# Patient Record
Sex: Female | Born: 1935 | Race: Black or African American | Hispanic: No | Marital: Married | Smoking: Never smoker
Health system: Southern US, Community
[De-identification: ages and names within clinical notes are randomized; demographics above are authoritative.]

## PROBLEM LIST (undated history)

## (undated) DIAGNOSIS — R413 Other amnesia: Secondary | ICD-10-CM

## (undated) DIAGNOSIS — I059 Rheumatic mitral valve disease, unspecified: Secondary | ICD-10-CM

## (undated) DIAGNOSIS — E785 Hyperlipidemia, unspecified: Secondary | ICD-10-CM

## (undated) DIAGNOSIS — F439 Reaction to severe stress, unspecified: Secondary | ICD-10-CM

## (undated) DIAGNOSIS — E7800 Pure hypercholesterolemia, unspecified: Secondary | ICD-10-CM

## (undated) DIAGNOSIS — H109 Unspecified conjunctivitis: Secondary | ICD-10-CM

## (undated) DIAGNOSIS — J0100 Acute maxillary sinusitis, unspecified: Secondary | ICD-10-CM

## (undated) DIAGNOSIS — I10 Essential (primary) hypertension: Secondary | ICD-10-CM

## (undated) DIAGNOSIS — J209 Acute bronchitis, unspecified: Secondary | ICD-10-CM

## (undated) DIAGNOSIS — J301 Allergic rhinitis due to pollen: Secondary | ICD-10-CM

## (undated) DIAGNOSIS — N390 Urinary tract infection, site not specified: Secondary | ICD-10-CM

## (undated) HISTORY — PX: ABDOMINAL HYSTERECTOMY: SHX81

## (undated) HISTORY — DX: Reaction to severe stress, unspecified: F43.9

## (undated) HISTORY — DX: Acute bronchitis, unspecified: J20.9

## (undated) HISTORY — DX: Acute maxillary sinusitis, unspecified: J01.00

## (undated) HISTORY — DX: Essential (primary) hypertension: I10

## (undated) HISTORY — DX: Rheumatic mitral valve disease, unspecified: I05.9

## (undated) HISTORY — PX: TONSILLECTOMY: SUR1361

## (undated) HISTORY — DX: Other amnesia: R41.3

## (undated) HISTORY — DX: Hyperlipidemia, unspecified: E78.5

## (undated) HISTORY — DX: Pure hypercholesterolemia, unspecified: E78.00

## (undated) HISTORY — PX: TUBAL LIGATION: SHX77

## (undated) HISTORY — DX: Allergic rhinitis due to pollen: J30.1

## (undated) HISTORY — DX: Urinary tract infection, site not specified: N39.0

## (undated) HISTORY — DX: Unspecified conjunctivitis: H10.9

---

## 1998-01-24 ENCOUNTER — Other Ambulatory Visit: Admission: RE | Admit: 1998-01-24 | Discharge: 1998-01-24 | Payer: Self-pay | Admitting: Obstetrics & Gynecology

## 1998-02-15 ENCOUNTER — Ambulatory Visit (HOSPITAL_COMMUNITY): Admission: RE | Admit: 1998-02-15 | Discharge: 1998-02-15 | Payer: Self-pay | Admitting: Internal Medicine

## 1998-03-29 ENCOUNTER — Ambulatory Visit (HOSPITAL_COMMUNITY): Admission: RE | Admit: 1998-03-29 | Discharge: 1998-03-29 | Payer: Self-pay | Admitting: Internal Medicine

## 1999-02-06 ENCOUNTER — Ambulatory Visit (HOSPITAL_COMMUNITY): Admission: RE | Admit: 1999-02-06 | Discharge: 1999-02-06 | Payer: Self-pay | Admitting: Gastroenterology

## 1999-03-19 ENCOUNTER — Other Ambulatory Visit: Admission: RE | Admit: 1999-03-19 | Discharge: 1999-03-19 | Payer: Self-pay | Admitting: *Deleted

## 1999-08-07 ENCOUNTER — Encounter: Admission: RE | Admit: 1999-08-07 | Discharge: 1999-08-07 | Payer: Self-pay | Admitting: Internal Medicine

## 1999-08-07 ENCOUNTER — Encounter: Payer: Self-pay | Admitting: Internal Medicine

## 2000-03-25 ENCOUNTER — Other Ambulatory Visit: Admission: RE | Admit: 2000-03-25 | Discharge: 2000-03-25 | Payer: Self-pay | Admitting: Obstetrics and Gynecology

## 2000-04-07 ENCOUNTER — Encounter: Payer: Self-pay | Admitting: Obstetrics and Gynecology

## 2000-04-07 ENCOUNTER — Encounter: Admission: RE | Admit: 2000-04-07 | Discharge: 2000-04-07 | Payer: Self-pay | Admitting: Obstetrics and Gynecology

## 2001-03-28 ENCOUNTER — Other Ambulatory Visit: Admission: RE | Admit: 2001-03-28 | Discharge: 2001-03-28 | Payer: Self-pay | Admitting: Obstetrics and Gynecology

## 2001-04-07 ENCOUNTER — Encounter: Admission: RE | Admit: 2001-04-07 | Discharge: 2001-04-07 | Payer: Self-pay | Admitting: Obstetrics and Gynecology

## 2001-04-07 ENCOUNTER — Encounter: Payer: Self-pay | Admitting: Obstetrics and Gynecology

## 2002-01-19 ENCOUNTER — Encounter (INDEPENDENT_AMBULATORY_CARE_PROVIDER_SITE_OTHER): Payer: Self-pay | Admitting: Cardiology

## 2002-01-19 ENCOUNTER — Ambulatory Visit (HOSPITAL_COMMUNITY): Admission: RE | Admit: 2002-01-19 | Discharge: 2002-01-19 | Payer: Self-pay | Admitting: Cardiology

## 2002-03-06 ENCOUNTER — Encounter: Payer: Self-pay | Admitting: Internal Medicine

## 2002-03-06 ENCOUNTER — Encounter: Admission: RE | Admit: 2002-03-06 | Discharge: 2002-03-06 | Payer: Self-pay | Admitting: Internal Medicine

## 2002-06-07 ENCOUNTER — Encounter: Admission: RE | Admit: 2002-06-07 | Discharge: 2002-06-07 | Payer: Self-pay | Admitting: Obstetrics and Gynecology

## 2002-06-07 ENCOUNTER — Encounter: Payer: Self-pay | Admitting: Obstetrics and Gynecology

## 2003-06-20 ENCOUNTER — Encounter: Admission: RE | Admit: 2003-06-20 | Discharge: 2003-06-20 | Payer: Self-pay | Admitting: Internal Medicine

## 2003-06-26 ENCOUNTER — Other Ambulatory Visit: Admission: RE | Admit: 2003-06-26 | Discharge: 2003-06-26 | Payer: Self-pay | Admitting: Obstetrics and Gynecology

## 2004-04-30 ENCOUNTER — Ambulatory Visit (HOSPITAL_COMMUNITY): Admission: RE | Admit: 2004-04-30 | Discharge: 2004-04-30 | Payer: Self-pay | Admitting: Gastroenterology

## 2004-07-15 ENCOUNTER — Encounter: Admission: RE | Admit: 2004-07-15 | Discharge: 2004-07-15 | Payer: Self-pay | Admitting: Internal Medicine

## 2004-08-22 ENCOUNTER — Encounter (INDEPENDENT_AMBULATORY_CARE_PROVIDER_SITE_OTHER): Payer: Self-pay | Admitting: *Deleted

## 2004-08-22 ENCOUNTER — Encounter: Admission: RE | Admit: 2004-08-22 | Discharge: 2004-08-22 | Payer: Self-pay | Admitting: Internal Medicine

## 2004-12-23 ENCOUNTER — Encounter: Admission: RE | Admit: 2004-12-23 | Discharge: 2004-12-23 | Payer: Self-pay | Admitting: Internal Medicine

## 2005-06-22 ENCOUNTER — Ambulatory Visit (HOSPITAL_COMMUNITY): Admission: RE | Admit: 2005-06-22 | Discharge: 2005-06-22 | Payer: Self-pay | Admitting: Cardiology

## 2005-07-16 ENCOUNTER — Encounter: Admission: RE | Admit: 2005-07-16 | Discharge: 2005-07-16 | Payer: Self-pay | Admitting: Internal Medicine

## 2006-01-05 ENCOUNTER — Other Ambulatory Visit: Admission: RE | Admit: 2006-01-05 | Discharge: 2006-01-05 | Payer: Self-pay | Admitting: Obstetrics and Gynecology

## 2006-02-22 ENCOUNTER — Encounter: Admission: RE | Admit: 2006-02-22 | Discharge: 2006-02-22 | Payer: Self-pay | Admitting: Internal Medicine

## 2006-03-11 ENCOUNTER — Encounter: Admission: RE | Admit: 2006-03-11 | Discharge: 2006-03-11 | Payer: Self-pay | Admitting: Internal Medicine

## 2006-07-19 ENCOUNTER — Encounter: Admission: RE | Admit: 2006-07-19 | Discharge: 2006-07-19 | Payer: Self-pay | Admitting: Internal Medicine

## 2006-12-27 ENCOUNTER — Encounter: Admission: RE | Admit: 2006-12-27 | Discharge: 2006-12-27 | Payer: Self-pay | Admitting: Internal Medicine

## 2007-02-01 ENCOUNTER — Encounter: Admission: RE | Admit: 2007-02-01 | Discharge: 2007-02-01 | Payer: Self-pay | Admitting: Obstetrics and Gynecology

## 2007-07-20 IMAGING — CR DG CHEST 2V
2 series · 2 of 2 positions shown · non-contrast
Comparison: 02/22/2006

CLINICAL DATA: Cough.

[w chest pa]
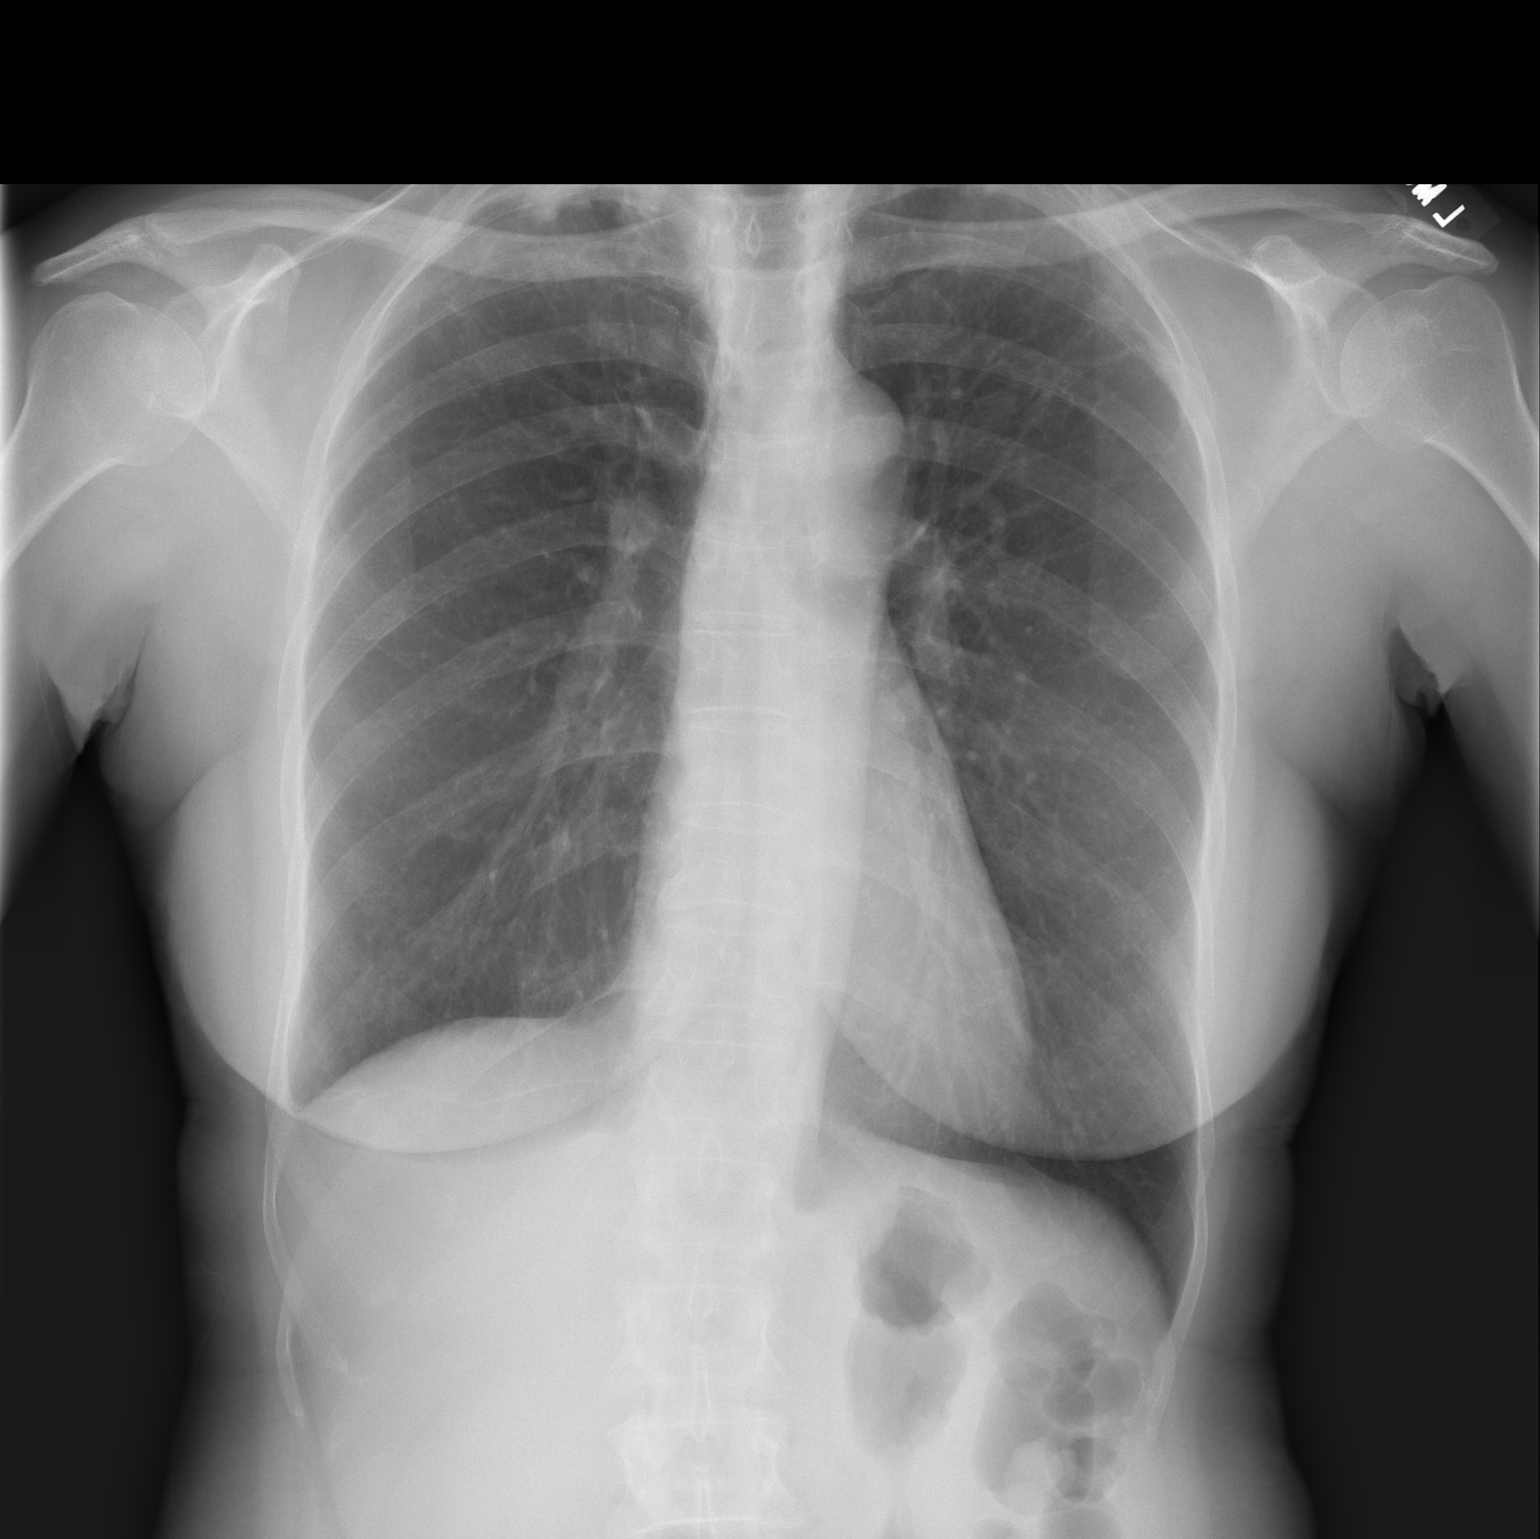

[w chest lat]
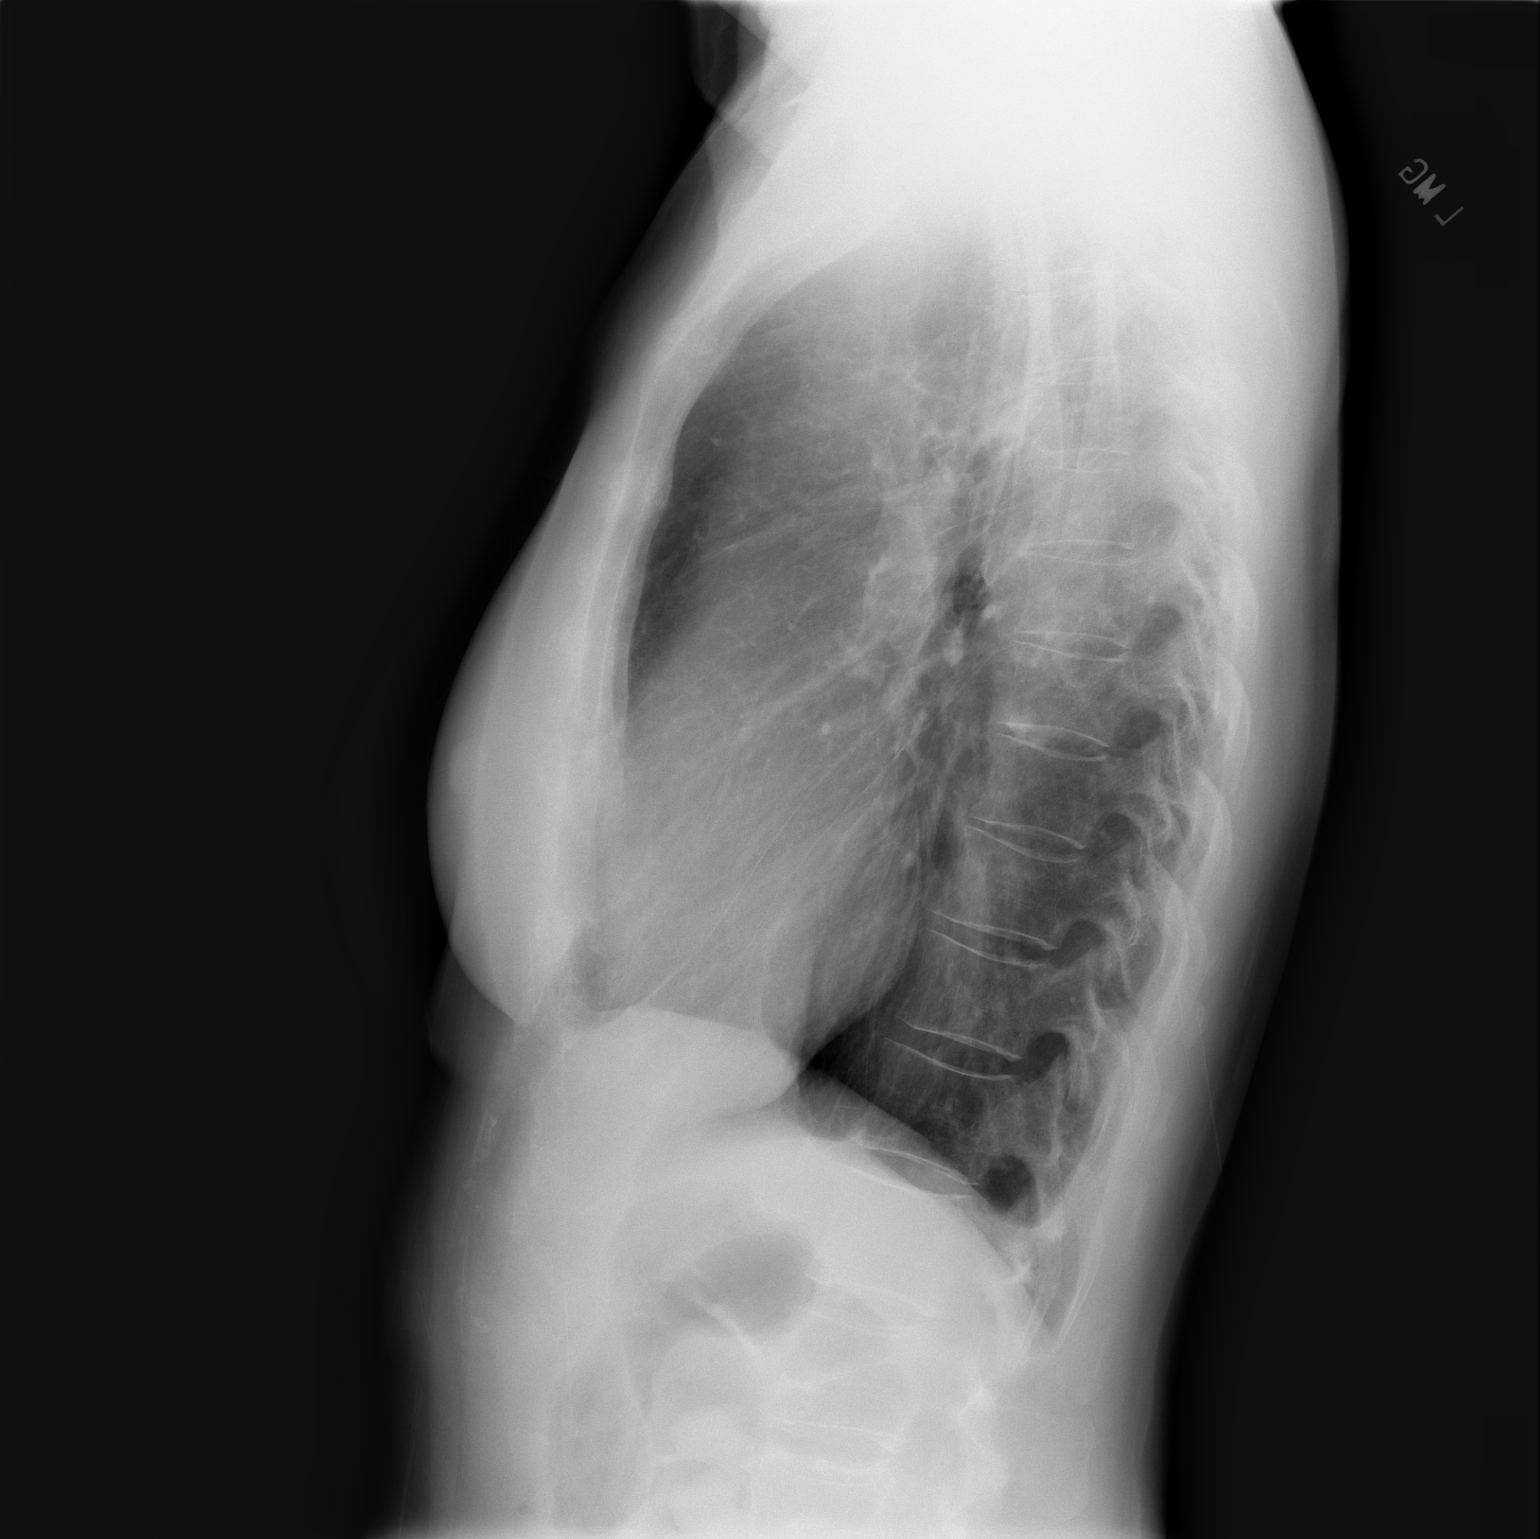

[2 of 2 positions shown; findings below may reference images not displayed]

CHEST - 2 VIEW:

Lungs remain hyperinflated. There is trace blunting of the right costophrenic
angle, unchanged. Biapical pleural densities are stable and asymmetrically worse
on the right than left. Heart size is within normal limits.
IMPRESSION: No interval change. There is asymmetric apical opacity on the right with
associated tiny right pleural effusion versus pleural scarring. Given the
asymmetry of the changes in the right apex, a followup chest x-ray in 3 months
is recommended.

## 2007-08-11 ENCOUNTER — Encounter: Admission: RE | Admit: 2007-08-11 | Discharge: 2007-08-11 | Payer: Self-pay | Admitting: Internal Medicine

## 2008-04-20 ENCOUNTER — Encounter: Admission: RE | Admit: 2008-04-20 | Discharge: 2008-04-20 | Payer: Self-pay | Admitting: Internal Medicine

## 2008-04-22 ENCOUNTER — Emergency Department (HOSPITAL_COMMUNITY): Admission: EM | Admit: 2008-04-22 | Discharge: 2008-04-22 | Payer: Self-pay | Admitting: Emergency Medicine

## 2008-05-10 ENCOUNTER — Encounter: Admission: RE | Admit: 2008-05-10 | Discharge: 2008-05-10 | Payer: Self-pay | Admitting: Internal Medicine

## 2008-08-29 ENCOUNTER — Encounter: Admission: RE | Admit: 2008-08-29 | Discharge: 2008-08-29 | Payer: Self-pay | Admitting: Internal Medicine

## 2009-04-27 ENCOUNTER — Emergency Department (HOSPITAL_COMMUNITY): Admission: EM | Admit: 2009-04-27 | Discharge: 2009-04-27 | Payer: Self-pay | Admitting: Family Medicine

## 2009-04-27 ENCOUNTER — Emergency Department (HOSPITAL_COMMUNITY): Admission: EM | Admit: 2009-04-27 | Discharge: 2009-04-27 | Payer: Self-pay | Admitting: Emergency Medicine

## 2009-07-22 ENCOUNTER — Encounter: Admission: RE | Admit: 2009-07-22 | Discharge: 2009-07-22 | Payer: Self-pay | Admitting: Internal Medicine

## 2009-10-15 ENCOUNTER — Encounter: Admission: RE | Admit: 2009-10-15 | Discharge: 2009-10-15 | Payer: Self-pay | Admitting: Internal Medicine

## 2010-04-20 ENCOUNTER — Encounter: Payer: Self-pay | Admitting: Internal Medicine

## 2010-07-14 LAB — COMPREHENSIVE METABOLIC PANEL
AST: 27 U/L (ref 0–37)
Albumin: 3.7 g/dL (ref 3.5–5.2)
Alkaline Phosphatase: 70 U/L (ref 39–117)
CO2: 28 mEq/L (ref 19–32)
Chloride: 105 mEq/L (ref 96–112)
GFR calc Af Amer: >60 mL/min (ref >60)
GFR calc non Af Amer: >60 mL/min (ref >60)
Potassium: 3.4 mEq/L — ABNORMAL LOW (ref 3.5–5.1)
Total Bilirubin: 0.8 mg/dL (ref 0.3–1.2)

## 2010-07-14 LAB — CBC
HCT: 36.2 % (ref 36.0–46.0)
Platelets: 193 10*3/uL (ref 150–400)
RBC: 3.72 MIL/uL — ABNORMAL LOW (ref 3.87–5.11)
WBC: 6.2 10*3/uL (ref 4.0–10.5)

## 2010-07-14 LAB — URINALYSIS, ROUTINE W REFLEX MICROSCOPIC
Ketones, ur: 15 mg/dL — AB
Nitrite: NEGATIVE
Protein, ur: 30 mg/dL — AB

## 2010-07-14 LAB — DIFFERENTIAL
Basophils Absolute: 0 10*3/uL (ref 0.0–0.1)
Basophils Relative: 0 % (ref 0–1)
Eosinophils Absolute: 0 10*3/uL (ref 0.0–0.7)
Eosinophils Relative: 1 % (ref 0–5)
Monocytes Absolute: 0.3 10*3/uL (ref 0.1–1.0)

## 2010-07-14 LAB — URINE MICROSCOPIC-ADD ON

## 2010-07-17 ENCOUNTER — Encounter: Payer: Self-pay | Admitting: Internal Medicine

## 2010-08-15 NOTE — Op Note (Signed)
NAME:  Sarah Zavala, Sarah Zavala                 ACCOUNT NO.:  [id number]   MEDICAL RECORD NO.:  [id number]          PATIENT TYPE:  AMB   LOCATION:  ENDO                         FACILITY:  [facility]   PHYSICIAN:  Anselmo Rod, M.D.  DATE OF BIRTH:  12/13/1929   DATE OF PROCEDURE:  04/30/2004  DATE OF DISCHARGE:                                 OPERATIVE REPORT   PROCEDURE PERFORMED:  Screening colonoscopy.   ENDOSCOPIST:  Charna Elizabeth, M.D.   INSTRUMENT USED:  Olympus video colonoscope.   INDICATIONS FOR PROCEDURE:  A 77 year old African-American female with a  history of left lower quadrant pain, occasional constipation, undergoing a  screening colonoscopy to rule out colonic polyps, masses, etc.   PREPROCEDURE PREPARATION:  Informed consent was procured from the patient.  The patient was fasted for eight hours prior to the procedure and prepped  with a bottle of magnesium citrate and a gallon of GoLYTELY the night prior  to the procedure.  The risks and benefits of the procedure including a 10%  miss rate for colon polyps or cancers was discussed with the patient as  well.   PREPROCEDURE PHYSICAL:  The patient had stable vital signs.  Neck supple.  Chest clear to auscultation.  S1 and S2 regular.  Abdomen soft with normal  bowel sounds.   DESCRIPTION OF PROCEDURE:  The patient was placed in left lateral decubitus  position and sedated with 75 mg of Demerol and 5 mg of Versed in slow  incremental doses.  Once the patient was adequately sedated and maintained  on low flow oxygen and continuous cardiac monitoring, the Olympus video  colonoscope was advanced from the rectum to the cecum.  The appendicular  orifice and ileocecal valve were clearly visualized and photographed.  No  masses, polyps, erosions, ulcerations or diverticula were seen.  Retroflexion in the rectum revealed  no abnormalities.   IMPRESSION:  Normal colonoscopy up to the cecum.  No masses or polyps were  seen.  No evidence  of diverticulosis.   RECOMMENDATIONS:  1.  Continue high fiber diet with liberal fluid intake.  2.  Repeat colonoscopy is recommended in the next 10 years unless the      patient develops any abnormal symptoms in the interim.  3.  Outpatient followup as need arises in the future.      JNM/MEDQ  D:  04/30/2004  T:  04/30/2004  Job:  [id number]   cc:   Minerva Areola L. August Saucer, M.D.  [address] [address]  [place]  [place] [address]  Fax: [phone]

## 2011-01-05 ENCOUNTER — Other Ambulatory Visit: Payer: Self-pay | Admitting: Internal Medicine

## 2011-01-05 DIAGNOSIS — Z1231 Encounter for screening mammogram for malignant neoplasm of breast: Secondary | ICD-10-CM

## 2011-01-08 ENCOUNTER — Ambulatory Visit
Admission: RE | Admit: 2011-01-08 | Discharge: 2011-01-08 | Disposition: A | Discharge status: Home / Self Care | Payer: Medicare HMO | Source: Ambulatory Visit | Attending: Internal Medicine | Admitting: Internal Medicine

## 2011-01-08 DIAGNOSIS — Z1231 Encounter for screening mammogram for malignant neoplasm of breast: Secondary | ICD-10-CM

## 2011-01-14 ENCOUNTER — Other Ambulatory Visit: Payer: Self-pay | Admitting: Internal Medicine

## 2011-01-14 DIAGNOSIS — R928 Other abnormal and inconclusive findings on diagnostic imaging of breast: Secondary | ICD-10-CM

## 2011-01-20 ENCOUNTER — Ambulatory Visit
Admission: RE | Admit: 2011-01-20 | Discharge: 2011-01-20 | Disposition: A | Discharge status: Home / Self Care | Payer: Medicare HMO | Source: Ambulatory Visit | Attending: Internal Medicine | Admitting: Internal Medicine

## 2011-01-20 DIAGNOSIS — R928 Other abnormal and inconclusive findings on diagnostic imaging of breast: Secondary | ICD-10-CM

## 2011-01-27 ENCOUNTER — Other Ambulatory Visit: Payer: Medicare HMO

## 2012-01-07 ENCOUNTER — Other Ambulatory Visit: Payer: Self-pay | Admitting: Internal Medicine

## 2012-01-07 DIAGNOSIS — Z1231 Encounter for screening mammogram for malignant neoplasm of breast: Secondary | ICD-10-CM

## 2012-02-10 ENCOUNTER — Ambulatory Visit
Admission: RE | Admit: 2012-02-10 | Discharge: 2012-02-10 | Disposition: A | Discharge status: Home / Self Care | Payer: Medicare HMO | Source: Ambulatory Visit | Attending: Internal Medicine | Admitting: Internal Medicine

## 2012-02-10 DIAGNOSIS — Z1231 Encounter for screening mammogram for malignant neoplasm of breast: Secondary | ICD-10-CM

## 2012-03-14 LAB — LIPID PANEL
LDL Cholesterol: 81 mg/dL
LDl/HDL Ratio: 2

## 2012-03-14 LAB — BASIC METABOLIC PANEL
BUN: 11 mg/dL (ref 4–21)
Creatinine: 0.7 mg/dL (ref 0.5–1.1)
Potassium: 4 mmol/L (ref 3.4–5.3)
Sodium: 141 mmol/L (ref 137–147)

## 2012-03-14 LAB — HEPATIC FUNCTION PANEL
AST: 20 U/L (ref 13–35)
Bilirubin, Total: 0.4 mg/dL

## 2012-03-14 LAB — TSH: TSH: 2.85 u[IU]/mL (ref 0.41–5.90)

## 2012-05-26 ENCOUNTER — Encounter: Payer: Self-pay | Admitting: Hematology

## 2012-09-09 ENCOUNTER — Ambulatory Visit (HOSPITAL_COMMUNITY)
Admission: RE | Admit: 2012-09-09 | Discharge: 2012-09-09 | Disposition: A | Discharge status: Home / Self Care | Payer: Medicare HMO | Source: Ambulatory Visit | Attending: Internal Medicine | Admitting: Internal Medicine

## 2012-09-09 ENCOUNTER — Other Ambulatory Visit (HOSPITAL_COMMUNITY): Payer: Self-pay | Admitting: Internal Medicine

## 2012-09-09 DIAGNOSIS — R0602 Shortness of breath: Secondary | ICD-10-CM | POA: Insufficient documentation

## 2012-09-09 DIAGNOSIS — M25569 Pain in unspecified knee: Secondary | ICD-10-CM

## 2012-09-09 DIAGNOSIS — M7989 Other specified soft tissue disorders: Secondary | ICD-10-CM

## 2012-09-09 DIAGNOSIS — M79609 Pain in unspecified limb: Secondary | ICD-10-CM

## 2012-09-09 NOTE — Progress Notes (Signed)
*  PRELIMINARY RESULTS* Vascular Ultrasound Lower extremity venous duplex has been completed.  Preliminary findings: negative for DVT bilaterally.    Farrel Demark, RDMS, RVT  09/09/2012, 2:36 PM

## 2012-10-21 ENCOUNTER — Other Ambulatory Visit: Payer: Self-pay | Admitting: Internal Medicine

## 2012-10-21 DIAGNOSIS — R1310 Dysphagia, unspecified: Secondary | ICD-10-CM

## 2012-10-25 ENCOUNTER — Ambulatory Visit
Admission: RE | Admit: 2012-10-25 | Discharge: 2012-10-25 | Disposition: A | Discharge status: Home / Self Care | Payer: Medicare HMO | Source: Ambulatory Visit | Attending: Internal Medicine | Admitting: Internal Medicine

## 2012-10-25 DIAGNOSIS — R1310 Dysphagia, unspecified: Secondary | ICD-10-CM

## 2013-01-03 ENCOUNTER — Other Ambulatory Visit: Payer: Self-pay

## 2013-01-03 DIAGNOSIS — Z1231 Encounter for screening mammogram for malignant neoplasm of breast: Secondary | ICD-10-CM

## 2013-01-20 ENCOUNTER — Encounter (HOSPITAL_COMMUNITY): Payer: Self-pay | Admitting: Emergency Medicine

## 2013-01-20 ENCOUNTER — Emergency Department (INDEPENDENT_AMBULATORY_CARE_PROVIDER_SITE_OTHER)
Admission: EM | Admit: 2013-01-20 | Discharge: 2013-01-20 | Disposition: A | Discharge status: Home / Self Care | Payer: Medicare HMO | Source: Home / Self Care | Attending: Family Medicine | Admitting: Family Medicine

## 2013-01-20 ENCOUNTER — Emergency Department (INDEPENDENT_AMBULATORY_CARE_PROVIDER_SITE_OTHER): Payer: Medicare HMO

## 2013-01-20 DIAGNOSIS — M6530 Trigger finger, unspecified finger: Secondary | ICD-10-CM

## 2013-01-20 DIAGNOSIS — M65341 Trigger finger, right ring finger: Secondary | ICD-10-CM

## 2013-01-20 NOTE — ED Provider Notes (Signed)
CSN: [id number]     Arrival date & time 01/20/13  0272 History   None    Chief Complaint  Patient presents with  . Hand Pain   (Consider location/radiation/quality/duration/timing/severity/associated sxs/prior Treatment) Patient is a 79 y.o. female presenting with hand pain. The history is provided by the patient.  Hand Pain This is a new problem. The current episode started more than 1 week ago (palmar soreness and triggering of rrf, similar problem occurred to right thumb and injected by dr Merlyn Lot.). The problem has not changed since onset.   Past Medical History  Diagnosis Date  . Acute bronchitis   . Allergic rhinitis due to pollen   . Acute maxillary sinusitis   . Essential hypertension, malignant   . Pure hypercholesterolemia   . Mitral valve disorders   . Hyperlipidemia   . UTI (lower urinary tract infection)   . Situational stress   . Conjunctivitis unspecified   . 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 No pertinent past surgical history. History reviewed. No pertinent family history. History  Substance Use Topics  . Smoking status: Never Smoker   . Smokeless tobacco: Not on file  . Alcohol Use: Not on file   OB History   Grav Para Term Preterm Abortions TAB SAB Ect Mult Living                 Review of Systems  Constitutional: Negative.   Musculoskeletal: Negative for joint swelling.  Skin: Negative.     Allergies  Acyclovir and related; Amoxicillin; Biaxin; and Ciprofloxacin  Home Medications   Current Outpatient Rx  Name  Route  Sig  Dispense  Refill  . aspirin 81 MG tablet   Oral   Take 81 mg by mouth daily.           . Calcium Carbonate-Vitamin D (CALCIUM PLUS VITAMIN D PO)   Oral   Take 1,200 mg by mouth daily.           Marland Kitchen diltiazem (DILACOR XR) 120 MG 24 hr capsule   Oral   Take 120 mg by mouth daily.         . dorzolamide (TRUSOPT) 2 % ophthalmic solution   Both Eyes   Place 1 drop into both eyes 2 (two) times  daily.         . memantine (NAMENDA) 10 MG tablet   Oral   Take 10 mg by mouth at bedtime.           . Multiple Vitamins-Minerals (CENTRUM SILVER PO)   Oral   Take by mouth daily.           . Omega-3 Fatty Acids (FISH OIL PO)   Oral   Take 1,000 mg by mouth daily.           . rosuvastatin (CRESTOR) 5 MG tablet   Oral   Take 10 mg by mouth daily.          . valACYclovir (VALTREX) 500 MG tablet   Oral   Take 500 mg by mouth daily.            BP 140/48  Pulse 72  Temp(Src) 97.8 F (36.6 C) (Oral)  Resp 18  SpO2 99% Physical Exam  Nursing note and vitals reviewed. Constitutional: She is oriented to person, place, and time. She appears well-developed and well-nourished.  Musculoskeletal: She exhibits tenderness.       Hands: Neurological: She is alert and oriented  to person, place, and time.  Skin: Skin is warm and dry.    ED Course  Procedures (including critical care time) Labs Review Labs Reviewed - No data to display Imaging Review Dg Hand Complete Right  01/20/2013   CLINICAL DATA:  Pain  EXAM: RIGHT HAND - COMPLETE 3+ VIEW  COMPARISON:  None.  FINDINGS: Frontal, oblique, and lateral views were obtained. There is no fracture or dislocation. There is osteoarthritic change in all PIP and DIP joints. No erosive change.  IMPRESSION: Relatively mild osteoarthritic change in all PIP and DIP joints. No erosive change. No fracture or dislocation.   Electronically Signed   By: Bretta Bang M.D.   On: 01/20/2013 09:40      MDM  X-rays reviewed and report per radiologist.    Barkley Bruns MD.     Linna Hoff, MD 01/20/13 [phone]

## 2013-01-20 NOTE — ED Notes (Signed)
Pt  denys  Any  Injury    -  She  Reports  r hand  Pain    X  sev  Weeks        -  No  Deformity  Noted

## 2013-02-13 ENCOUNTER — Ambulatory Visit
Admission: RE | Admit: 2013-02-13 | Discharge: 2013-02-13 | Disposition: A | Discharge status: Home / Self Care | Payer: Medicare HMO | Source: Ambulatory Visit

## 2013-02-13 DIAGNOSIS — Z1231 Encounter for screening mammogram for malignant neoplasm of breast: Secondary | ICD-10-CM

## 2014-01-15 DIAGNOSIS — H02402 Unspecified ptosis of left eyelid: Secondary | ICD-10-CM | POA: Insufficient documentation

## 2014-01-24 DIAGNOSIS — I10 Essential (primary) hypertension: Secondary | ICD-10-CM | POA: Insufficient documentation

## 2014-01-24 DIAGNOSIS — I341 Nonrheumatic mitral (valve) prolapse: Secondary | ICD-10-CM | POA: Insufficient documentation

## 2014-07-04 ENCOUNTER — Other Ambulatory Visit (HOSPITAL_COMMUNITY): Payer: Self-pay | Admitting: Cardiology

## 2014-07-04 DIAGNOSIS — R079 Chest pain, unspecified: Secondary | ICD-10-CM

## 2014-07-16 ENCOUNTER — Ambulatory Visit (HOSPITAL_COMMUNITY)
Admission: RE | Admit: 2014-07-16 | Discharge: 2014-07-16 | Disposition: A | Discharge status: Home / Self Care | Payer: Medicare Other | Source: Ambulatory Visit | Attending: Cardiology | Admitting: Cardiology

## 2014-07-16 ENCOUNTER — Encounter (HOSPITAL_COMMUNITY)
Admission: RE | Admit: 2014-07-16 | Discharge: 2014-07-16 | Disposition: A | Discharge status: Home / Self Care | Payer: Medicare Other | Source: Ambulatory Visit | Attending: Cardiology | Admitting: Cardiology

## 2014-07-16 DIAGNOSIS — R079 Chest pain, unspecified: Secondary | ICD-10-CM | POA: Diagnosis present

## 2014-07-16 MED ORDER — TECHNETIUM TC 99M SESTAMIBI GENERIC - CARDIOLITE
30.0000 | Freq: Once | INTRAVENOUS | Status: AC | PRN
  Administered 2014-07-16: 30 via INTRAVENOUS

## 2014-07-16 MED ORDER — REGADENOSON 0.4 MG/5ML IV SOLN
INTRAVENOUS | Status: AC
  Administered 2014-07-16: 0.4 mg via INTRAVENOUS
  Filled 2014-07-16: qty 5

## 2014-07-16 MED ORDER — TECHNETIUM TC 99M SESTAMIBI GENERIC - CARDIOLITE
10.0000 | Freq: Once | INTRAVENOUS | Status: AC | PRN
  Administered 2014-07-16: 10 via INTRAVENOUS

## 2014-07-16 MED ORDER — REGADENOSON 0.4 MG/5ML IV SOLN
0.4000 mg | Freq: Once | INTRAVENOUS | Status: AC
  Administered 2014-07-16: 0.4 mg via INTRAVENOUS

## 2014-08-07 ENCOUNTER — Ambulatory Visit (HOSPITAL_COMMUNITY): Payer: Medicare HMO

## 2014-08-08 ENCOUNTER — Other Ambulatory Visit (HOSPITAL_COMMUNITY): Payer: Self-pay | Admitting: Orthopedic Surgery

## 2014-08-08 ENCOUNTER — Ambulatory Visit (HOSPITAL_COMMUNITY)
Admission: RE | Admit: 2014-08-08 | Discharge: 2014-08-08 | Disposition: A | Discharge status: Home / Self Care | Payer: Medicare Other | Source: Ambulatory Visit | Attending: Cardiology | Admitting: Cardiology

## 2014-08-08 DIAGNOSIS — M7989 Other specified soft tissue disorders: Secondary | ICD-10-CM

## 2014-08-08 NOTE — Progress Notes (Signed)
*  PRELIMINARY RESULTS* Vascular Ultrasound Left lower extremity venous duplex has been completed.  Preliminary findings: negative for DVT and baker's cyst.  Attempted to call report to Dr. Diamantina Providenceean's office with no answer.   Farrel DemarkJill Eunice, RDMS, RVT  08/08/2014, 2:29 PM

## 2014-11-19 ENCOUNTER — Other Ambulatory Visit: Payer: Self-pay | Admitting: Internal Medicine

## 2014-11-19 ENCOUNTER — Ambulatory Visit
Admission: RE | Admit: 2014-11-19 | Discharge: 2014-11-19 | Disposition: A | Discharge status: Home / Self Care | Payer: Medicare Other | Source: Ambulatory Visit | Attending: Internal Medicine | Admitting: Internal Medicine

## 2014-11-19 DIAGNOSIS — R52 Pain, unspecified: Secondary | ICD-10-CM

## 2014-12-24 ENCOUNTER — Other Ambulatory Visit: Payer: Self-pay

## 2014-12-24 DIAGNOSIS — Z1231 Encounter for screening mammogram for malignant neoplasm of breast: Secondary | ICD-10-CM

## 2014-12-25 ENCOUNTER — Ambulatory Visit
Admission: RE | Admit: 2014-12-25 | Discharge: 2014-12-25 | Disposition: A | Discharge status: Home / Self Care | Payer: Medicare Other | Source: Ambulatory Visit

## 2014-12-25 DIAGNOSIS — Z1231 Encounter for screening mammogram for malignant neoplasm of breast: Secondary | ICD-10-CM

## 2014-12-28 ENCOUNTER — Other Ambulatory Visit: Payer: Self-pay | Admitting: Internal Medicine

## 2014-12-28 DIAGNOSIS — R928 Other abnormal and inconclusive findings on diagnostic imaging of breast: Secondary | ICD-10-CM

## 2015-01-08 ENCOUNTER — Ambulatory Visit
Admission: RE | Admit: 2015-01-08 | Discharge: 2015-01-08 | Disposition: A | Discharge status: Home / Self Care | Payer: Medicare Other | Source: Ambulatory Visit | Attending: Internal Medicine | Admitting: Internal Medicine

## 2015-01-08 DIAGNOSIS — R928 Other abnormal and inconclusive findings on diagnostic imaging of breast: Secondary | ICD-10-CM

## 2015-10-03 ENCOUNTER — Encounter (HOSPITAL_COMMUNITY): Payer: Self-pay | Admitting: Emergency Medicine

## 2015-10-03 ENCOUNTER — Ambulatory Visit (HOSPITAL_COMMUNITY)
Admission: EM | Admit: 2015-10-03 | Discharge: 2015-10-03 | Disposition: A | Discharge status: Home / Self Care | Payer: Medicare Other | Attending: Emergency Medicine | Admitting: Emergency Medicine

## 2015-10-03 ENCOUNTER — Ambulatory Visit (INDEPENDENT_AMBULATORY_CARE_PROVIDER_SITE_OTHER): Payer: Medicare Other

## 2015-10-03 DIAGNOSIS — T148 Other injury of unspecified body region: Secondary | ICD-10-CM

## 2015-10-03 DIAGNOSIS — W19XXXA Unspecified fall, initial encounter: Secondary | ICD-10-CM

## 2015-10-03 DIAGNOSIS — T148XXA Other injury of unspecified body region, initial encounter: Secondary | ICD-10-CM

## 2015-10-03 NOTE — ED Provider Notes (Signed)
CSN: [id number]     Arrival date & time 10/03/15  1318 History   First MD Initiated Contact with Patient 10/03/15 1601     Chief Complaint  Patient presents with  . Fall   (Consider location/radiation/quality/duration/timing/severity/associated sxs/prior Treatment) HPI History obtained from patient: Patient presents with chief complaint of pain in her left hip after falling out of bed a few days ago. She states her pain score at this time is about 2. Pain is a dull aching pain. Discomfort is constant by patient's report home treatment has included Tylenol cold compresses and a cane to walk with. She states that she has had some double with her balance but that is been quite some time now and is not new. She does have family history of hypertension.      Past Medical History  Diagnosis Date  . Acute bronchitis   . Allergic rhinitis due to pollen   . Acute maxillary sinusitis   . Essential hypertension, malignant   . Pure hypercholesterolemia   . Mitral valve disorders   . Hyperlipidemia   . UTI (lower urinary tract infection)   . Situational stress   . Conjunctivitis unspecified   . 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 No pertinent past surgical history. No family history on file. Social History  Substance Use Topics  . Smoking status: Never Smoker   . Smokeless tobacco: None  . Alcohol Use: None   OB History    No data available     Review of Systems  Denies: HEADACHE, NAUSEA, ABDOMINAL PAIN, CHEST PAIN, CONGESTION, DYSURIA, SHORTNESS OF BREATH  Allergies  Acyclovir and related; Amoxicillin; Biaxin; and Ciprofloxacin  Home Medications   Prior to Admission medications   Medication Sig Start Date End Date Taking? Authorizing Provider  aspirin 81 MG tablet Take 81 mg by mouth daily.      Historical Provider, MD  Calcium Carbonate-Vitamin D (CALCIUM PLUS VITAMIN D PO) Take 1,200 mg by mouth daily.      Historical Provider, MD  diltiazem (DILACOR XR)  120 MG 24 hr capsule Take 120 mg by mouth daily.    Historical Provider, MD  dorzolamide (TRUSOPT) 2 % ophthalmic solution Place 1 drop into both eyes 2 (two) times daily.    Historical Provider, MD  memantine (NAMENDA) 10 MG tablet Take 10 mg by mouth at bedtime.      Historical Provider, MD  Multiple Vitamins-Minerals (CENTRUM SILVER PO) Take by mouth daily.      Historical Provider, MD  Omega-3 Fatty Acids (FISH OIL PO) Take 1,000 mg by mouth daily.      Historical Provider, MD  rosuvastatin (CRESTOR) 5 MG tablet Take 10 mg by mouth daily.     Historical Provider, MD  valACYclovir (VALTREX) 500 MG tablet Take 500 mg by mouth daily.      Historical Provider, MD   Meds Ordered and Administered this Visit  Medications - No data to display  BP 159/60 mmHg  Pulse 60  Temp(Src) 97.8 F (36.6 C) (Oral)  Resp 12  SpO2 100% No data found.   Physical Exam NURSES NOTES AND VITAL SIGNS REVIEWED. CONSTITUTIONAL: Well developed, well nourished, no acute distress HEENT: normocephalic, atraumatic EYES: Conjunctiva normal NECK:normal ROM, supple, no adenopathy PULMONARY:No respiratory distress, normal effort ABDOMINAL: Soft, ND, NT BS+, No CVAT MUSCULOSKELETAL: Normal ROM of all extremities, No bruising noted to the anterior right thigh and right knee and left hip is mildly tender to palpation.  But has full range of motion. No bruising is noted on this knee. SKIN: warm and dry without rash PSYCHIATRIC: Mood and affect, behavior are normal  ED Course  Procedures (including critical care time)  Labs Review Labs Reviewed - No data to display  Imaging Review Dg Hip Unilat With Pelvis 2-3 Views Left  10/03/2015  CLINICAL DATA:  Recent fall on 09/28/2015 with left hip pain, initial encounter EXAM: DG HIP (WITH OR WITHOUT PELVIS) 2-3V LEFT COMPARISON:  None. FINDINGS: There is no evidence of hip fracture or dislocation. There is no evidence of arthropathy or other focal bone abnormality.  IMPRESSION: No acute fracture noted. Electronically Signed   By: Alcide CleverMark  Lukens M.D.   On: 10/03/2015 16:23    Reviewed and used as part of MDM Visual Acuity Review  Right Eye Distance:   Left Eye Distance:   Bilateral Distance:    Right Eye Near:   Left Eye Near:    Bilateral Near:         MDM   1. Contusion   2. Fall     Patient is reassured that there are no issues that require transfer to higher level of care at this time or additional tests. Patient is advised to continue home symptomatic treatment. Patient is advised that if there are new or worsening symptoms to attend the emergency department, contact primary care provider, or return to UC. Instructions of care provided discharged home in stable condition.    THIS NOTE WAS GENERATED USING A VOICE RECOGNITION SOFTWARE PROGRAM. ALL REASONABLE EFFORTS  WERE MADE TO PROOFREAD THIS DOCUMENT FOR ACCURACY.  I have verbally reviewed the discharge instructions with the patient. A printed AVS was given to the patient.  All questions were answered prior to discharge.      Tharon AquasFrank C Avonelle Viveros, PA 10/03/15 1918

## 2015-10-03 NOTE — Discharge Instructions (Signed)
Heat Therapy Heat therapy can help ease sore, stiff, injured, and tight muscles and joints. Heat relaxes your muscles, which may help ease your pain. Heat therapy should only be used on old, pre-existing, or long-lasting (chronic) injuries. Do not use heat therapy unless told by your doctor. HOW TO USE HEAT THERAPY There are several different kinds of heat therapy, including:  Moist heat pack.  Warm water bath.  Hot water bottle.  Electric heating pad.  Heated gel pack.  Heated wrap.  Electric heating pad. GENERAL HEAT THERAPY RECOMMENDATIONS   Do not sleep while using heat therapy. Only use heat therapy while you are awake.  Your skin may turn pink while using heat therapy. Do not use heat therapy if your skin turns red.  Do not use heat therapy if you have new pain.  High heat or long exposure to heat can cause burns. Be careful when using heat therapy to avoid burning your skin.  Do not use heat therapy on areas of your skin that are already irritated, such as with a rash or sunburn. GET HELP IF:   You have blisters, redness, swelling (puffiness), or numbness.  You have new pain.  Your pain is worse. MAKE SURE YOU:  Understand these instructions.  Will watch your condition.  Will get help right away if you are not doing well or get worse.   This information is not intended to replace advice given to you by your health care provider. Make sure you discuss any questions you have with your health care provider.   Document Released: 06/08/2011 Document Revised: 04/06/2014 Document Reviewed: 05/09/2013 Elsevier Interactive Patient Education 2016 Else[employer] Inc[employer] Contusion A contusion is a deep bruise. Contusions happen when an injury causes bleeding under the skin. Symptoms of bruising include pain, swelling, and discolored skin. The skin may turn blue, purple, or yellow. HOME CARE   Rest the injured area.  If told, put ice on the injured area.  Put ice in a  plastic bag.  Place a towel between your skin and the bag.  Leave the ice on for 20 minutes, 2-3 times per day.  If told, put light pressure (compression) on the injured area using an elastic bandage. Make sure the bandage is not too tight. Remove it and put it back on as told by your doctor.  If possible, raise (elevate) the injured area above the level of your heart while you are sitting or lying down.  Take over-the-counter and prescription medicines only as told by your doctor. GET HELP IF:  Your symptoms do not get better after several days of treatment.  Your symptoms get worse.  You have trouble moving the injured area. GET HELP RIGHT AWAY IF:   You have very bad pain.  You have a loss of feeling (numbness) in a hand or foot.  Your hand or foot turns pale or cold.   This information is not intended to replace advice given to you by your health care provider. Make sure you discuss any questions you have with your health care provider.   Document Released: 09/02/2007 Document Revised: 12/05/2014 Document Reviewed: 08/01/2014 Elsevier Interactive Patient Education [employer]

## 2015-10-03 NOTE — ED Notes (Signed)
Patient reports falling out of bed.  Left hip pain, right thigh bruising, left knee pain, scrape to chest, left hand soreness.

## 2015-12-09 ENCOUNTER — Other Ambulatory Visit: Payer: Self-pay | Admitting: Internal Medicine

## 2015-12-12 ENCOUNTER — Other Ambulatory Visit: Payer: Self-pay | Admitting: Internal Medicine

## 2015-12-12 DIAGNOSIS — R5381 Other malaise: Secondary | ICD-10-CM

## 2015-12-18 ENCOUNTER — Other Ambulatory Visit: Payer: Self-pay | Admitting: Internal Medicine

## 2015-12-18 DIAGNOSIS — N644 Mastodynia: Secondary | ICD-10-CM

## 2015-12-27 ENCOUNTER — Ambulatory Visit
Admission: RE | Admit: 2015-12-27 | Discharge: 2015-12-27 | Disposition: A | Discharge status: Home / Self Care | Payer: Medicare Other | Source: Ambulatory Visit | Attending: Internal Medicine | Admitting: Internal Medicine

## 2015-12-27 ENCOUNTER — Other Ambulatory Visit: Payer: Self-pay

## 2015-12-27 DIAGNOSIS — N644 Mastodynia: Secondary | ICD-10-CM

## 2016-08-03 ENCOUNTER — Ambulatory Visit (HOSPITAL_COMMUNITY)
Admission: RE | Admit: 2016-08-03 | Discharge: 2016-08-03 | Disposition: A | Discharge status: Home / Self Care | Payer: Medicare Other | Source: Ambulatory Visit | Attending: Internal Medicine | Admitting: Internal Medicine

## 2016-08-03 ENCOUNTER — Other Ambulatory Visit (HOSPITAL_COMMUNITY): Payer: Self-pay | Admitting: Internal Medicine

## 2016-08-03 ENCOUNTER — Encounter (INDEPENDENT_AMBULATORY_CARE_PROVIDER_SITE_OTHER): Payer: Self-pay

## 2016-08-03 DIAGNOSIS — R52 Pain, unspecified: Secondary | ICD-10-CM

## 2016-08-03 DIAGNOSIS — M79604 Pain in right leg: Secondary | ICD-10-CM | POA: Insufficient documentation

## 2016-08-03 NOTE — Progress Notes (Signed)
*  PRELIMINARY RESULTS* Vascular Ultrasound Bilateral lower extremity venous duplex has been completed.  Preliminary findings: No evidence of deep vein thrombosis or baker's cysts bilaterally.  Preliminary report called to Dr. August Saucerean, he was unavailable so I left a message @ 15:45   Sarah FischerCharlotte C Annaelle Zavala 08/03/2016, 3:46 PM

## 2016-08-05 ENCOUNTER — Other Ambulatory Visit: Payer: Self-pay | Admitting: Cardiology

## 2016-08-05 DIAGNOSIS — R079 Chest pain, unspecified: Secondary | ICD-10-CM

## 2016-08-26 ENCOUNTER — Encounter (HOSPITAL_COMMUNITY): Payer: 59

## 2016-10-26 ENCOUNTER — Other Ambulatory Visit: Payer: Self-pay | Admitting: Internal Medicine

## 2016-10-26 DIAGNOSIS — R058 Other specified cough: Secondary | ICD-10-CM

## 2016-10-26 DIAGNOSIS — R05 Cough: Secondary | ICD-10-CM

## 2016-10-26 DIAGNOSIS — R0600 Dyspnea, unspecified: Secondary | ICD-10-CM

## 2016-10-28 ENCOUNTER — Ambulatory Visit
Admission: RE | Admit: 2016-10-28 | Discharge: 2016-10-28 | Disposition: A | Discharge status: Home / Self Care | Payer: Medicare Other | Source: Ambulatory Visit | Attending: Internal Medicine | Admitting: Internal Medicine

## 2016-10-28 DIAGNOSIS — R0600 Dyspnea, unspecified: Secondary | ICD-10-CM

## 2016-10-28 DIAGNOSIS — R058 Other specified cough: Secondary | ICD-10-CM

## 2016-10-28 DIAGNOSIS — R05 Cough: Secondary | ICD-10-CM

## 2016-12-29 ENCOUNTER — Other Ambulatory Visit: Payer: Self-pay | Admitting: Internal Medicine

## 2016-12-29 DIAGNOSIS — Z1231 Encounter for screening mammogram for malignant neoplasm of breast: Secondary | ICD-10-CM

## 2017-01-07 ENCOUNTER — Ambulatory Visit: Payer: Medicare Other

## 2017-01-26 ENCOUNTER — Ambulatory Visit
Admission: RE | Admit: 2017-01-26 | Discharge: 2017-01-26 | Disposition: A | Discharge status: Home / Self Care | Payer: Medicare Other | Source: Ambulatory Visit | Attending: Internal Medicine | Admitting: Internal Medicine

## 2017-01-26 DIAGNOSIS — Z1231 Encounter for screening mammogram for malignant neoplasm of breast: Secondary | ICD-10-CM

## 2017-03-26 ENCOUNTER — Encounter (HOSPITAL_COMMUNITY): Payer: Self-pay | Admitting: Family Medicine

## 2017-03-26 ENCOUNTER — Ambulatory Visit (INDEPENDENT_AMBULATORY_CARE_PROVIDER_SITE_OTHER): Payer: Medicare Other

## 2017-03-26 ENCOUNTER — Ambulatory Visit (HOSPITAL_COMMUNITY)
Admission: EM | Admit: 2017-03-26 | Discharge: 2017-03-26 | Disposition: A | Discharge status: Home / Self Care | Payer: Medicare Other | Attending: Internal Medicine | Admitting: Internal Medicine

## 2017-03-26 DIAGNOSIS — R05 Cough: Secondary | ICD-10-CM

## 2017-03-26 DIAGNOSIS — J019 Acute sinusitis, unspecified: Secondary | ICD-10-CM | POA: Diagnosis not present

## 2017-03-26 MED ORDER — FLUTICASONE PROPIONATE 50 MCG/ACT NA SUSP: 2.0000 | Freq: Every day | NASAL | 0 refills | Status: AC

## 2017-03-26 MED ORDER — DOXYCYCLINE HYCLATE 100 MG PO CAPS: 100.0000 mg | ORAL_CAPSULE | Freq: Two times a day (BID) | ORAL | 0 refills | Status: AC

## 2017-03-26 MED ORDER — BENZONATATE 100 MG PO CAPS: 100.0000 mg | ORAL_CAPSULE | Freq: Three times a day (TID) | ORAL | 0 refills | Status: AC

## 2017-03-26 MED ORDER — CETIRIZINE HCL 5 MG PO TABS: 5.0000 mg | ORAL_TABLET | Freq: Every day | ORAL | 0 refills | Status: AC

## 2017-03-26 NOTE — Discharge Instructions (Signed)
Chest x-ray without any acute changes.  Symptoms could be due to sinus infection that is causing drainage and causing continued coughing.  Start amoxicillin as directed. Tessalon for cough. Start flonase, zyrtec for nasal congestion. You can use over the counter nasal saline rinse such as neti pot for nasal congestion. Keep hydrated, your urine should be clear to pale yellow in color. Tylenol/motrin for fever and pain. Monitor for any worsening of symptoms, chest pain, shortness of breath, wheezing, swelling of the throat, follow up for reevaluation.

## 2017-03-26 NOTE — ED Triage Notes (Signed)
Pt here URI symptoms for over a month and now she feels rattling in her chest and not getting better.

## 2017-03-26 NOTE — ED Provider Notes (Signed)
[facility]    CSN: [id number] Arrival date & time: 03/26/17  1238     History   Chief Complaint Chief Complaint  Patient presents with  . URI    HPI Sarah Zavala is a 83 y.o. female.   83 year old female with history of HTN, HLD, mitral valve disorder, age-related memory impairment comes in for over a month history of URI symptoms.  Patient is poor historian and difficulty with timeline.  She expresses she has had cough that has persisted for at least a month, that was nonproductive and not productive.  She denies rhinorrhea, nasal congestion.  Has had some ear popping without pain.  Denies fever, chills, night sweats.  OTC Mucinex with some relief.  She denies chest pain, wheezing.  She has been experiencing dyspnea on exertion, though she stated this started prior to current symptom onset, and denies worsening.  She states this was evaluated by her cardiologist, who she saw last month, was told without problems.  She states she saw her primary care at one point for a chest x-ray, but does not recall when.  She does state that it has been a few months.  No sick contact.  Never smoker.      Past Medical History:  Diagnosis Date  . Acute bronchitis   . Acute maxillary sinusitis   . Allergic rhinitis due to pollen   . Conjunctivitis unspecified   . Essential hypertension, malignant   . Hyperlipidemia   . Memory impairment    age related  . Mitral valve disorders(424.0)   . Pure hypercholesterolemia   . Situational stress   . UTI (lower urinary tract infection)     There are no active problems to display for this patient.   History reviewed. No pertinent surgical history.  OB History    No data available       Home Medications    Prior to Admission medications   Medication Sig Start Date End Date Taking? Authorizing Provider  aspirin 81 MG tablet Take 81 mg by mouth daily.      [provider]  benzonatate (TESSALON) 100 MG capsule Take 1  capsule (100 mg total) by mouth every 8 (eight) hours. 03/26/17   Cathie HoopsYu, Amy V, PA-C  Calcium Carbonate-Vitamin D (CALCIUM PLUS VITAMIN D PO) Take 1,200 mg by mouth daily.      [provider]  cetirizine (ZYRTEC) 5 MG tablet Take 1 tablet (5 mg total) by mouth daily. 03/26/17   Cathie HoopsYu, Amy V, PA-C  diltiazem (DILACOR XR) 120 MG 24 hr capsule Take 120 mg by mouth daily.    [provider]  dorzolamide (TRUSOPT) 2 % ophthalmic solution Place 1 drop into both eyes 2 (two) times daily.    [provider]  doxycycline (VIBRAMYCIN) 100 MG capsule Take 1 capsule (100 mg total) by mouth 2 (two) times daily for 7 days. 03/26/17 04/02/17  Cathie HoopsYu, Amy V, PA-C  fluticasone (FLONASE) 50 MCG/ACT nasal spray Place 2 sprays into both nostrils daily. 03/26/17   Cathie HoopsYu, Amy V, PA-C  memantine (NAMENDA) 10 MG tablet Take 10 mg by mouth at bedtime.      [provider]  Multiple Vitamins-Minerals (CENTRUM SILVER PO) Take by mouth daily.      [provider]  Omega-3 Fatty Acids (FISH OIL PO) Take 1,000 mg by mouth daily.      [provider]  rosuvastatin (CRESTOR) 5 MG tablet Take 10 mg by mouth daily.  [provider]  valACYclovir (VALTREX) 500 MG tablet Take 500 mg by mouth daily.      [provider]    Family History History reviewed. No pertinent family history.  Social History Social History   Tobacco Use  . Smoking status: Never Smoker  . Smokeless tobacco: Never Used  Substance Use Topics  . Alcohol use: Not on file  . Drug use: Not on file     Allergies   Acyclovir and related; Amoxicillin; Biaxin [clarithromycin]; and Ciprofloxacin   Review of Systems Review of Systems  Reason unable to perform ROS: See HPI as above.     Physical Exam Triage Vital Signs ED Triage Vitals [03/26/17 1312]  Enc Vitals Group     BP (!) 155/73     Pulse Rate 85     Resp 18     Temp 98.2 F (36.8 C)     Temp src      SpO2 100 %     Weight        Height      Head Circumference      Peak Flow      Pain Score      Pain Loc      Pain Edu?      Excl. in GC?    No data found.  Updated Vital Signs BP (!) 155/73   Pulse 85   Temp 98.2 F (36.8 C)   Resp 18   SpO2 100%   Physical Exam  Constitutional: She is oriented to person, place, and time. She appears well-developed and well-nourished. No distress.  HENT:  Head: Normocephalic and atraumatic.  Right Ear: Tympanic membrane, external ear and ear canal normal. Tympanic membrane is not erythematous and not bulging.  Left Ear: Tympanic membrane, external ear and ear canal normal. Tympanic membrane is not erythematous and not bulging.  Nose: Nose normal. Right sinus exhibits no maxillary sinus tenderness and no frontal sinus tenderness. Left sinus exhibits no maxillary sinus tenderness and no frontal sinus tenderness.  Mouth/Throat: Uvula is midline, oropharynx is clear and moist and mucous membranes are normal.  Eyes: Conjunctivae are normal. Pupils are equal, round, and reactive to light.  Neck: Normal range of motion. Neck supple.  Cardiovascular: Normal rate, regular rhythm and normal heart sounds. Exam reveals no gallop and no friction rub.  No murmur heard. Pulmonary/Chest: Effort normal and breath sounds normal. She has no decreased breath sounds. She has no wheezes. She has no rhonchi. She has no rales.  Lymphadenopathy:    She has no cervical adenopathy.  Neurological: She is alert and oriented to person, place, and time.  Skin: Skin is warm and dry.  Psychiatric: She has a normal mood and affect. Her behavior is normal. Judgment normal.     UC Treatments / Results  Labs (all labs ordered are listed, but only abnormal results are displayed) Labs Reviewed - No data to display  EKG  EKG Interpretation None       Radiology Dg Chest 2 View  Result Date: 03/26/2017 CLINICAL DATA:  Cough and congestion for the past month. EXAM: CHEST  2 VIEW COMPARISON:   Chest x-ray dated October 28, 2016. FINDINGS: The cardiomediastinal silhouette is normal in size. Normal pulmonary vascularity. The lungs remain hyperinflated. Biapical pleuroparenchymal scarring is unchanged. No focal consolidation, pleural effusion, or pneumothorax. No acute osseous abnormality. IMPRESSION: Stable chronic changes.  No active cardiopulmonary disease. Electronically Signed   By: Vickki HearingWilliam T Derry M.D.  On: 03/26/2017 14:01    Procedures Procedures (including critical care time)  Medications Ordered in UC Medications - No data to display   Initial Impression / Assessment and Plan / UC Course  I have reviewed the triage vital signs and the nursing notes.  Pertinent labs & imaging results that were available during my care of the patient were reviewed by me and considered in my medical decision making (see chart for details).    Chest x-ray without any active cardiopulmonary disease.  Given length of symptoms, will treat for sinusitis.  Patient with allergies to amoxicillin, start doxycycline as directed.  Other symptomatic treatment discussed.  Patient to follow-up with PCP for further evaluation and treatment needed.  Return precautions given.  Patient expresses understanding and agrees to plan.  Final Clinical Impressions(s) / UC Diagnoses   Final diagnoses:  Acute non-recurrent sinusitis, unspecified location    ED Discharge Orders        Ordered    doxycycline (VIBRAMYCIN) 100 MG capsule  2 times daily     03/26/17 1417    benzonatate (TESSALON) 100 MG capsule  Every 8 hours     03/26/17 1417    fluticasone (FLONASE) 50 MCG/ACT nasal spray  Daily     03/26/17 1417    cetirizine (ZYRTEC) 5 MG tablet  Daily     03/26/17 1417        Belinda Fisher, PA-C 03/26/17 1428

## 2017-12-29 ENCOUNTER — Other Ambulatory Visit: Payer: Self-pay | Admitting: Internal Medicine

## 2017-12-29 DIAGNOSIS — Z1231 Encounter for screening mammogram for malignant neoplasm of breast: Secondary | ICD-10-CM

## 2018-02-01 ENCOUNTER — Ambulatory Visit
Admission: RE | Admit: 2018-02-01 | Discharge: 2018-02-01 | Disposition: A | Discharge status: Home / Self Care | Payer: Medicare Other | Source: Ambulatory Visit | Attending: Internal Medicine | Admitting: Internal Medicine

## 2018-02-01 DIAGNOSIS — Z1231 Encounter for screening mammogram for malignant neoplasm of breast: Secondary | ICD-10-CM

## 2019-01-02 ENCOUNTER — Other Ambulatory Visit: Payer: Self-pay | Admitting: Internal Medicine

## 2019-01-02 DIAGNOSIS — Z1231 Encounter for screening mammogram for malignant neoplasm of breast: Secondary | ICD-10-CM

## 2019-02-02 ENCOUNTER — Other Ambulatory Visit: Payer: Self-pay

## 2019-02-02 DIAGNOSIS — Z20822 Contact with and (suspected) exposure to covid-19: Secondary | ICD-10-CM

## 2019-02-03 LAB — NOVEL CORONAVIRUS, NAA: SARS-CoV-2, NAA: NOT DETECTED

## 2019-02-15 ENCOUNTER — Ambulatory Visit
Admission: RE | Admit: 2019-02-15 | Discharge: 2019-02-15 | Disposition: A | Discharge status: Home / Self Care | Payer: Medicare Other | Source: Ambulatory Visit | Attending: Internal Medicine | Admitting: Internal Medicine

## 2019-02-15 ENCOUNTER — Other Ambulatory Visit: Payer: Self-pay

## 2019-02-15 DIAGNOSIS — Z1231 Encounter for screening mammogram for malignant neoplasm of breast: Secondary | ICD-10-CM

## 2019-02-21 ENCOUNTER — Ambulatory Visit (HOSPITAL_COMMUNITY)
Admission: RE | Admit: 2019-02-21 | Discharge: 2019-02-21 | Disposition: A | Discharge status: Home / Self Care | Payer: Medicare Other | Source: Ambulatory Visit | Attending: Family | Admitting: Family

## 2019-02-21 ENCOUNTER — Other Ambulatory Visit: Payer: Self-pay

## 2019-02-21 ENCOUNTER — Other Ambulatory Visit (HOSPITAL_COMMUNITY): Payer: Self-pay | Admitting: Internal Medicine

## 2019-02-21 DIAGNOSIS — M79605 Pain in left leg: Secondary | ICD-10-CM | POA: Diagnosis present

## 2019-02-21 DIAGNOSIS — M79604 Pain in right leg: Secondary | ICD-10-CM

## 2019-05-08 ENCOUNTER — Other Ambulatory Visit: Payer: Self-pay | Admitting: Internal Medicine

## 2019-05-08 DIAGNOSIS — R7989 Other specified abnormal findings of blood chemistry: Secondary | ICD-10-CM

## 2019-05-08 DIAGNOSIS — R0602 Shortness of breath: Secondary | ICD-10-CM

## 2019-05-16 ENCOUNTER — Other Ambulatory Visit: Payer: Self-pay

## 2019-05-16 ENCOUNTER — Other Ambulatory Visit: Payer: Self-pay | Admitting: Internal Medicine

## 2019-05-16 ENCOUNTER — Ambulatory Visit
Admission: RE | Admit: 2019-05-16 | Discharge: 2019-05-16 | Disposition: A | Discharge status: Home / Self Care | Payer: Medicare Other | Source: Ambulatory Visit | Attending: Internal Medicine | Admitting: Internal Medicine

## 2019-05-16 DIAGNOSIS — R7989 Other specified abnormal findings of blood chemistry: Secondary | ICD-10-CM

## 2019-05-16 DIAGNOSIS — R0602 Shortness of breath: Secondary | ICD-10-CM

## 2019-05-16 MED ORDER — IOPAMIDOL (ISOVUE-370) INJECTION 76%
75.0000 mL | Freq: Once | INTRAVENOUS | Status: AC | PRN
  Administered 2019-05-16: 60 mL via INTRAVENOUS

## 2019-11-14 ENCOUNTER — Other Ambulatory Visit: Payer: Self-pay | Admitting: Internal Medicine

## 2019-11-14 DIAGNOSIS — R918 Other nonspecific abnormal finding of lung field: Secondary | ICD-10-CM

## 2019-11-14 DIAGNOSIS — R911 Solitary pulmonary nodule: Secondary | ICD-10-CM

## 2019-11-24 ENCOUNTER — Other Ambulatory Visit: Payer: Self-pay | Admitting: Internal Medicine

## 2019-11-24 DIAGNOSIS — R4781 Slurred speech: Secondary | ICD-10-CM

## 2019-11-24 DIAGNOSIS — G3184 Mild cognitive impairment, so stated: Secondary | ICD-10-CM

## 2019-11-27 ENCOUNTER — Other Ambulatory Visit: Payer: Medicare Other

## 2019-11-27 ENCOUNTER — Inpatient Hospital Stay: Admission: RE | Admit: 2019-11-27 | Payer: Medicare Other | Source: Ambulatory Visit

## 2020-01-29 ENCOUNTER — Other Ambulatory Visit: Payer: Self-pay

## 2020-01-29 ENCOUNTER — Emergency Department (HOSPITAL_COMMUNITY): Payer: Medicare Other

## 2020-01-29 ENCOUNTER — Encounter (HOSPITAL_COMMUNITY): Payer: Self-pay

## 2020-01-29 ENCOUNTER — Emergency Department (HOSPITAL_COMMUNITY)
Admission: EM | Admit: 2020-01-29 | Discharge: 2020-01-29 | Disposition: A | Discharge status: Home / Self Care | Payer: Medicare Other | Attending: Emergency Medicine | Admitting: Emergency Medicine

## 2020-01-29 DIAGNOSIS — R059 Cough, unspecified: Secondary | ICD-10-CM | POA: Diagnosis not present

## 2020-01-29 DIAGNOSIS — Z79899 Other long term (current) drug therapy: Secondary | ICD-10-CM | POA: Insufficient documentation

## 2020-01-29 DIAGNOSIS — I10 Essential (primary) hypertension: Secondary | ICD-10-CM | POA: Insufficient documentation

## 2020-01-29 DIAGNOSIS — Z7982 Long term (current) use of aspirin: Secondary | ICD-10-CM | POA: Diagnosis not present

## 2020-01-29 DIAGNOSIS — R042 Hemoptysis: Secondary | ICD-10-CM | POA: Insufficient documentation

## 2020-01-29 LAB — BASIC METABOLIC PANEL
Anion gap: 13 (ref 5–15)
BUN: 42 mg/dL — ABNORMAL HIGH (ref 8–23)
CO2: 27 mmol/L (ref 22–32)
Calcium: 10.7 mg/dL — ABNORMAL HIGH (ref 8.9–10.3)
Chloride: 100 mmol/L (ref 98–111)
Creatinine, Ser: 1.18 mg/dL — ABNORMAL HIGH (ref 0.44–1.00)
GFR, Estimated: 44 mL/min — ABNORMAL LOW (ref >60)
Glucose, Bld: 89 mg/dL (ref 70–99)
Potassium: 3.7 mmol/L (ref 3.5–5.1)
Sodium: 140 mmol/L (ref 135–145)

## 2020-01-29 LAB — CBC WITH DIFFERENTIAL/PLATELET
Abs Immature Granulocytes: 0.01 10*3/uL (ref 0.00–0.07)
Basophils Absolute: 0 10*3/uL (ref 0.0–0.1)
Basophils Relative: 1 %
Eosinophils Absolute: 0.2 10*3/uL (ref 0.0–0.5)
Eosinophils Relative: 4 %
HCT: 32 % — ABNORMAL LOW (ref 36.0–46.0)
Hemoglobin: 10.4 g/dL — ABNORMAL LOW (ref 12.0–15.0)
Immature Granulocytes: 0 %
Lymphocytes Relative: 24 %
Lymphs Abs: 1.3 10*3/uL (ref 0.7–4.0)
MCH: 32.5 pg (ref 26.0–34.0)
MCHC: 32.5 g/dL (ref 30.0–36.0)
MCV: 100 fL (ref 80.0–100.0)
Monocytes Absolute: 0.5 10*3/uL (ref 0.1–1.0)
Monocytes Relative: 10 %
Neutro Abs: 3.2 10*3/uL (ref 1.7–7.7)
Neutrophils Relative %: 61 %
Platelets: 212 10*3/uL (ref 150–400)
RBC: 3.2 MIL/uL — ABNORMAL LOW (ref 3.87–5.11)
RDW: 14.3 % (ref 11.5–15.5)
WBC: 5.3 10*3/uL (ref 4.0–10.5)
nRBC: 0 % (ref 0.0–0.2)

## 2020-01-29 LAB — D-DIMER, QUANTITATIVE: D-Dimer, Quant: 1.99 ug/mL-FEU — ABNORMAL HIGH (ref 0.00–0.50)

## 2020-01-29 MED ORDER — IOHEXOL 350 MG/ML SOLN
100.0000 mL | Freq: Once | INTRAVENOUS | Status: AC | PRN
  Administered 2020-01-29: 80 mL via INTRAVENOUS

## 2020-01-29 MED ORDER — SODIUM CHLORIDE 0.9 % IV BOLUS
500.0000 mL | Freq: Once | INTRAVENOUS | Status: AC
  Administered 2020-01-29: 500 mL via INTRAVENOUS

## 2020-01-29 NOTE — ED Provider Notes (Signed)
[facility]-EMERGENCY DEPT Provider Note   CSN: [id number] Arrival date & time: 01/29/20  1427     History Chief Complaint  Patient presents with  . Hemoptysis    Sarah Zavala is a 86 y.o. female presenting to Ed after coughing up blood.  She noted last night she had a cough and produced a dime-size fleck of dark blood.  This has never happened before.  She reports she has not had excessive or chronic cough. She does not smoke.   She does report receiving the covid pzifer booster vaccine 3 days ago.  She denies fevers, chills.  She is not on blood thinners  She denies lightheadedness or SOB.  She reports she is under a lot of emotional stress as her husband passed away last month.  She lives with her son, and her grandson is staying with them at this time.  PCP is Dr Willey Blade   No hx asymmetric LE edema. Patient denies personal or family history of DVT or PE. No recent hormone use (including OCP); travel for >6 hours; prolonged immobilization for greater than 3 days; surgeries or trauma in the last 4 weeks; or malignancy with treatment within 6 months.    HPI     Past Medical History:  Diagnosis Date  . Acute bronchitis   . Acute maxillary sinusitis   . Allergic rhinitis due to pollen   . Conjunctivitis unspecified   . Essential hypertension, malignant   . Hyperlipidemia   . Memory impairment    age related  . Mitral valve disorders(424.0)   . Pure hypercholesterolemia   . Situational stress   . UTI (lower urinary tract infection)     There are no problems to display for this patient.   Past Surgical History:  Procedure Laterality Date  . ABDOMINAL HYSTERECTOMY    . TONSILLECTOMY    . TUBAL LIGATION       OB History   No obstetric history on file.     Family History  Problem Relation Age of Onset  . Breast cancer Neg Hx     Social History   Tobacco Use  . Smoking status: Never Smoker  . Smokeless tobacco: Never Used    Vaping Use  . Vaping Use: Never used  Substance Use Topics  . Alcohol use: Never  . Drug use: Never    Home Medications Prior to Admission medications   Medication Sig Start Date End Date Taking? Authorizing Provider  aspirin 81 MG tablet Take 81 mg by mouth daily.      [provider]  benzonatate (TESSALON) 100 MG capsule Take 1 capsule (100 mg total) by mouth every 8 (eight) hours. 03/26/17   Cathie Hoops, Amy V, PA-C  Calcium Carbonate-Vitamin D (CALCIUM PLUS VITAMIN D PO) Take 1,200 mg by mouth daily.      [provider]  cetirizine (ZYRTEC) 5 MG tablet Take 1 tablet (5 mg total) by mouth daily. 03/26/17   Cathie Hoops, Amy V, PA-C  diltiazem (DILACOR XR) 120 MG 24 hr capsule Take 120 mg by mouth daily.    [provider]  dorzolamide (TRUSOPT) 2 % ophthalmic solution Place 1 drop into both eyes 2 (two) times daily.    [provider]  fluticasone (FLONASE) 50 MCG/ACT nasal spray Place 2 sprays into both nostrils daily. 03/26/17   Cathie Hoops, Amy V, PA-C  memantine (NAMENDA) 10 MG tablet Take 10 mg by mouth at bedtime.      [provider]  Multiple Vitamins-Minerals (CENTRUM SILVER PO) Take by mouth daily.      [provider]  Omega-3 Fatty Acids (FISH OIL PO) Take 1,000 mg by mouth daily.      [provider]  rosuvastatin (CRESTOR) 5 MG tablet Take 10 mg by mouth daily.     [provider]  valACYclovir (VALTREX) 500 MG tablet Take 500 mg by mouth daily.      [provider]    Allergies    Acyclovir and related, Amoxicillin, Biaxin [clarithromycin], and Ciprofloxacin  Review of Systems   Review of Systems  Constitutional: Negative for chills and fever.  HENT: Negative for ear pain and sore throat.   Eyes: Negative for pain and visual disturbance.  Respiratory: Positive for cough. Negative for shortness of breath.   Cardiovascular: Negative for chest pain and palpitations.  Gastrointestinal: Negative for abdominal  pain, nausea and vomiting.  Genitourinary: Negative for dysuria and hematuria.  Musculoskeletal: Negative for arthralgias and myalgias.  Skin: Negative for color change and rash.  Neurological: Negative for syncope and headaches.  Psychiatric/Behavioral: Negative for agitation and confusion.  All other systems reviewed and are negative.   Physical Exam Updated Vital Signs BP 138/62 (BP Location: Left Arm)   Pulse 72   Temp 98.5 F (36.9 C) (Oral)   Resp 14   Ht 5\' 5"  (1.651 m)   Wt 64.4 kg   SpO2 100%   BMI 23.63 kg/m   Physical Exam Vitals and nursing note reviewed.  Constitutional:      General: She is not in acute distress.    Appearance: She is well-developed.  HENT:     Head: Normocephalic and atraumatic.  Eyes:     Conjunctiva/sclera: Conjunctivae normal.     Pupils: Pupils are equal, round, and reactive to light.  Cardiovascular:     Rate and Rhythm: Normal rate and regular rhythm.     Pulses: Normal pulses.  Pulmonary:     Effort: Pulmonary effort is normal. No respiratory distress.     Breath sounds: Normal breath sounds.  Abdominal:     General: There is no distension.     Palpations: Abdomen is soft.     Tenderness: There is no abdominal tenderness.  Musculoskeletal:     Cervical back: Neck supple.  Skin:    General: Skin is warm and dry.  Neurological:     General: No focal deficit present.     Mental Status: She is alert and oriented to person, place, and time.  Psychiatric:        Mood and Affect: Mood normal.        Behavior: Behavior normal.     ED Results / Procedures / Treatments   Labs (all labs ordered are listed, but only abnormal results are displayed) Labs Reviewed  BASIC METABOLIC PANEL - Abnormal; Notable for the following components:      Result Value   BUN 42 (*)    Creatinine, Ser 1.18 (*)    Calcium 10.7 (*)    GFR, Estimated 44 (*)    All other components within normal limits  CBC WITH DIFFERENTIAL/PLATELET - Abnormal;  Notable for the following components:   RBC 3.20 (*)    Hemoglobin 10.4 (*)    HCT 32.0 (*)    All other components within normal limits  D-DIMER, QUANTITATIVE (NOT AT [facility]) - Abnormal; Notable for the following components:   D-Dimer, Quant 1.99 (*)    All other components within  normal limits    EKG None  Radiology CT Angio Chest PE W and/or Wo Contrast  Result Date: 01/29/2020 CLINICAL DATA:  Hemoptysis, elevated D-dimer. COVID booster vaccination 3 days prior EXAM: CT ANGIOGRAPHY CHEST WITH CONTRAST TECHNIQUE: Multidetector CT imaging of the chest was performed using the standard protocol during bolus administration of intravenous contrast. Multiplanar CT image reconstructions and MIPs were obtained to evaluate the vascular anatomy. CONTRAST:  67mL OMNIPAQUE IOHEXOL 350 MG/ML SOLN COMPARISON:  04/22/2008, 05/16/2019 FINDINGS: Cardiovascular: There is adequate opacification of the pulmonary arterial tree no significant coronary artery calcification. No intraluminal filling defect identified to suggest acute pulmonary embolism. Global cardiac size within normal limits. No pericardial effusion. Central pulmonary arteries are of normal caliber. Minimal atherosclerotic calcification within the thoracic aorta. No aortic aneurysm. Mediastinum/Nodes: There is shotty left axillary adenopathy which is nonspecific and may relate to the given history of recent vaccination. No frankly pathologic adenopathy within the thorax. 14 mm right thyroid nodule noted. Further follow-up is not required given its size and the patient's age. The esophagus is unremarkable Lungs/Pleura: There is asymmetric bronchiectasis, airway impaction, and parenchymal scarring within the right upper lobe which appears similar in appearance to prior examination. Differential considerations are unchanged, in keeping with the sequela of remote infection or changes of ongoing atypical infection such as mycobacterial or fungal infection.  While stable since immediate prior examination, these are progressive since remote prior examination of 2010. Mild pleuroparenchymal scarring is seen at the left apex. Stable 10 mm nodule within the basilar right middle lobe, axial image # 103/6. Unchanged micro nodularity within the right lower lobe at the lung base, possibly the sequela of remote infection or inflammation. No pneumothorax or pleural effusion. No central obstructing mass. Upper Abdomen: No acute abnormality. Musculoskeletal: No acute bone abnormality. Review of the MIP images confirms the above findings. IMPRESSION: No pulmonary embolism. Bronchiectasis, architectural distortion, and airway impaction within the right upper lobe most in keeping with chronic atypical mycobacterial or fungal infection. While stable since prior examination, the parenchymal changes are progressive since remote prior examination of 04/22/2008. Stable right middle lobe pulmonary nodule and adjacent right lower lobe micronodularity, possibly the sequela of remote infection or inflammation. Given its size, continued follow-up evaluation is recommended at 18-24 months from the original examination. This recommendation follows the consensus statement: Guidelines for Management of Incidental Pulmonary Nodules Detected on CT Images: From the Fleischner Society 2017; Radiology 2017; 284:228-243. Aortic Atherosclerosis (ICD10-I70.0). Electronically Signed   By: Helyn Numbers MD   On: 01/29/2020 19:37   DG Chest Portable 1 View  Result Date: 01/29/2020 CLINICAL DATA:  Hemoptysis. EXAM: PORTABLE CHEST 1 VIEW COMPARISON:  CT chest 05/16/2019, chest radiograph 03/18/2017. FINDINGS: Approximately 7 mm pulmonary nodule the right lower lung and biapical pleuroparenchymal nodularity/scarring, better characterized on CT chest from 05/16/2019. No visible pleural effusions or pneumothorax. Similar cardiomediastinal silhouette. No acute osseous abnormality. IMPRESSION: 1. Approximately  7 mm pulmonary nodule in the right lower lung and biapical pleuroparenchymal nodularity/scarring. Given the finding seen on CT chest from 05/16/2019 and the patient's reported hemoptysis, recommend chest CT with contrast to better evaluate for interval change and exclude malignancy. 2. No consolidation. Electronically Signed   By: Feliberto Harts MD   On: 01/29/2020 16:11    Procedures Procedures (including critical care time)  Medications Ordered in ED Medications  sodium chloride 0.9 % bolus 500 mL (0 mLs Intravenous Stopped 01/29/20 2018)  iohexol (OMNIPAQUE) 350 MG/ML injection 100 mL (80 mLs Intravenous  Contrast Given 01/29/20 1855)    ED Course  I have reviewed the triage vital signs and the nursing notes.  Pertinent labs & imaging results that were available during my care of the patient were reviewed by me and considered in my medical decision making (see chart for details).  86 year old female presenting with hemoptysis last night, single coughing episode.  DDx includes bronchitis vs lung ca vs PE vs other  No TB risk factors or fevers.  Will check BMP, CBC, DDimer Xray chest To consider CT PE  *  DDimer elevated 1.99, BMP & CBC at baseline.  No significant anemia or blood loss.  Doubt massive pulmonary bleed at this time.  DG chest and CT PE reviewed, showing consolidations and scar tissue in the right lung field.  The patient reports she's had this "ever since I had pneumonia years ago."  This is likely a chronic finding.  There is no PE here, and I doubt an active PNA with no fever, leukocytosis, coughing, etc.    I gave her a copy of her report to take to her PCP and review.  She is stable for discharge at this time.  Clinical Course as of Jan 30 1112  Mon Jan 29, 2020  1948 IMPRESSION: No pulmonary embolism.  Bronchiectasis, architectural distortion, and airway impaction within the right upper lobe most in keeping with chronic atypical mycobacterial or fungal  infection. While stable since prior examination, the parenchymal changes are progressive since remote prior examination of 04/22/2008.  Stable right middle lobe pulmonary nodule and adjacent right lower lobe micronodularity, possibly the sequela of remote infection or inflammation. Given its size, continued follow-up evaluation is recommended at 18-24 months from the original examination. This recommendation follows the consensus statement: Guidelines for Management of Incidental Pulmonary Nodules Detected on CT Images: From the Fleischner Society 2017; Radiology 2017; 284:228-243.  Aortic Atherosclerosis (ICD10-I70.0).   [MT]  1952 Pt remains asymptomatic - doubt acute PNA with this CT report.  Will provide report to take to PCP   [MT]    Clinical Course User Index [MT] Elcie Pelster, Kermit BaloMatthew J, MD    Final Clinical Impression(s) / ED Diagnoses Final diagnoses:  Hemoptysis    Rx / DC Orders ED Discharge Orders    None       Terald Sleeperrifan, Olar Santini J, MD 01/30/20 1113

## 2020-01-29 NOTE — ED Triage Notes (Signed)
Patient states she coughed up a dime size dark red clot last night and SOB, but none today. Patient states she had the Covid booster shot 3 days ago.

## 2020-07-18 ENCOUNTER — Other Ambulatory Visit (HOSPITAL_COMMUNITY): Payer: Self-pay | Admitting: Internal Medicine

## 2020-07-18 ENCOUNTER — Encounter (HOSPITAL_COMMUNITY): Payer: Medicare Other

## 2020-07-18 DIAGNOSIS — R600 Localized edema: Secondary | ICD-10-CM

## 2020-07-19 ENCOUNTER — Ambulatory Visit (HOSPITAL_COMMUNITY)
Admission: RE | Admit: 2020-07-19 | Discharge: 2020-07-19 | Disposition: A | Discharge status: Home / Self Care | Payer: Medicare Other | Source: Ambulatory Visit | Attending: Internal Medicine | Admitting: Internal Medicine

## 2020-07-19 ENCOUNTER — Other Ambulatory Visit: Payer: Self-pay

## 2020-07-19 DIAGNOSIS — R600 Localized edema: Secondary | ICD-10-CM | POA: Diagnosis not present

## 2021-05-05 ENCOUNTER — Other Ambulatory Visit: Payer: Self-pay

## 2021-05-05 ENCOUNTER — Ambulatory Visit
Admission: RE | Admit: 2021-05-05 | Discharge: 2021-05-05 | Disposition: A | Discharge status: Home / Self Care | Payer: Medicare Other | Source: Ambulatory Visit | Attending: Internal Medicine | Admitting: Internal Medicine

## 2021-05-05 ENCOUNTER — Other Ambulatory Visit: Payer: Self-pay | Admitting: Internal Medicine

## 2021-05-05 DIAGNOSIS — R058 Other specified cough: Secondary | ICD-10-CM

## 2021-05-07 ENCOUNTER — Encounter: Payer: Self-pay | Admitting: *Deleted

## 2021-05-07 ENCOUNTER — Ambulatory Visit (INDEPENDENT_AMBULATORY_CARE_PROVIDER_SITE_OTHER): Payer: Medicare Other | Admitting: Internal Medicine

## 2021-05-07 ENCOUNTER — Encounter: Payer: Self-pay | Admitting: Internal Medicine

## 2021-05-07 ENCOUNTER — Telehealth: Payer: Self-pay | Admitting: Internal Medicine

## 2021-05-07 ENCOUNTER — Other Ambulatory Visit: Payer: Self-pay

## 2021-05-07 DIAGNOSIS — J984 Other disorders of lung: Secondary | ICD-10-CM

## 2021-05-07 NOTE — Patient Instructions (Signed)
I will contact the Health Department to come to your house and have you checked for TB.  In the meantime you cannot travel especially on a airplane without risking infecting others  Once health dept releases you, I will set up follow up for you.  [place] is the local contact  Your contacts should wear facemasks in public until we sort out whether this is TB.

## 2021-05-07 NOTE — Progress Notes (Signed)
Sarah Zavala, female    DOB: [birth year]    MRN: [id number]   Brief patient profile:  32 yobf never smoker from [place] moved to [place] age 88 shortly thereafter started with cough >  dx TB [facility] treated with a "thick brown shake daily" and moved back to [place] around 2000  referred to pulmonary clinic 05/07/2021 by Kevan Ny for evaluation of cavitary lung dz     History of Present Illness  05/07/2021  Pulmonary/ 1st office eval/Caspian Deleonardis  Chief Complaint  Patient presents with   Consult    C/o increasing sob, abnormal CXR,cough-white  Dyspnea:  increasing doe x 3-6 months  Cough: more than usual, just white minimal in am  Sleep: one pillow/ flat bed SABA use: none  Poor appetite/ 20 lb wt loss  No f /chills/NS  No obvious day to day or daytime variability or assoc  purulent sputum or mucus plugs or hemoptysis or cp or chest tightness, subjective wheeze or overt sinus or hb symptoms.   Sleeping  without nocturnal  or early am exacerbation  of respiratory  c/o's or need for noct saba. Also denies any obvious fluctuation of symptoms with weather or environmental changes or other aggravating or alleviating factors except as outlined above   No unusual exposure hx or h/o childhood pna/ asthma or knowledge of premature birth.  Current Allergies, Complete Past Medical History, Past Surgical History, Family History, and Social History were reviewed in [employer] record.  ROS  The following are not active complaints unless bolded Hoarseness, sore throat, dysphagia, dental problems, itching, sneezing,  nasal congestion or discharge of excess mucus or purulent secretions, ear ache,   fever, chills, sweats, unintended wt loss or wt gain, classically pleuritic or exertional cp,  orthopnea pnd or arm/hand swelling  or leg swelling, presyncope, palpitations, abdominal pain, anorexia, nausea, vomiting, diarrhea  or change in bowel habits or change in bladder habits, change  in stools or change in urine, dysuria, hematuria,  rash, arthralgias, visual complaints, headache, numbness, weakness or ataxia or problems with walking or coordination,  change in mood or  memory.           Past Medical History:  Diagnosis Date   Acute bronchitis    Acute maxillary sinusitis    Allergic rhinitis due to pollen    Conjunctivitis unspecified    Essential hypertension, malignant    Hyperlipidemia    Memory impairment    age related   Mitral valve disorders(424.0)    Pure hypercholesterolemia    Situational stress    UTI (lower urinary tract infection)     Outpatient Medications Prior to Visit  Medication Sig Dispense Refill   aspirin 81 MG tablet Take 81 mg by mouth daily.       Calcium Carbonate-Vitamin D (CALCIUM PLUS VITAMIN D PO) Take 1,200 mg by mouth daily.       dorzolamide (TRUSOPT) 2 % ophthalmic solution Place 1 drop into both eyes 2 (two) times daily.     memantine (NAMENDA) 10 MG tablet Take 10 mg by mouth at bedtime.       Multiple Vitamins-Minerals (CENTRUM SILVER PO) Take by mouth daily.       rosuvastatin (CRESTOR) 5 MG tablet Take 10 mg by mouth daily.      valACYclovir (VALTREX) 500 MG tablet Take 500 mg by mouth daily.       benzonatate (TESSALON) 100 MG capsule Take 1 capsule (100 mg total) by  mouth every 8 (eight) hours. (Patient not taking: Reported on 05/07/2021) 21 capsule 0   cetirizine (ZYRTEC) 5 MG tablet Take 1 tablet (5 mg total) by mouth daily. (Patient not taking: Reported on 05/07/2021) 15 tablet 0   diltiazem (DILACOR XR) 120 MG 24 hr capsule Take 120 mg by mouth daily. (Patient not taking: Reported on 05/07/2021)     fluticasone (FLONASE) 50 MCG/ACT nasal spray Place 2 sprays into both nostrils daily. (Patient not taking: Reported on 05/07/2021) 1 g 0   Omega-3 Fatty Acids (FISH OIL PO) Take 1,000 mg by mouth daily.   (Patient not taking: Reported on 05/07/2021)     No facility-administered medications prior to visit.     Objective:      BP 118/62 (BP Location: Left Arm, Cuff Size: Normal)    Pulse 89    Temp 98.9 F (37.2 C) (Temporal)    Ht 5\' 5"  (1.651 m)    Wt 130 lb 9.6 oz (59.2 kg)    SpO2 100% Comment: RA   BMI 21.73 kg/m   SpO2: 100 % (RA)  Thin pleasant amb bf nad   HEENT : pt wearing mask not removed for exam due to covid -19 concerns.    NECK :  without JVD/Nodes/TM/ nl carotid upstrokes bilaterally   LUNGS: no acc muscle use,  Nl contour chest which is clear to A and P bilaterally without cough on insp or exp maneuvers   CV:  RRR  no s3 or murmur or increase in P2, and no edema   ABD:  soft and nontender with nl inspiratory excursion in the supine position. No bruits or organomegaly appreciated, bowel sounds nl  MS:  Nl gait/ ext warm without deformities, calf tenderness, cyanosis or clubbing No obvious joint restrictions   SKIN: warm and dry without lesions    NEURO:  alert, approp, nl sensorium with  no motor or cerebellar deficits apparent.    I personally reviewed images and agree with radiology impression as follows:  CXR:   pa and lat 05/05/21 Increasing opacity in the right apex with associated internal rounded lucencies. The previous CT scan described bronchiectasis, architectural distortion, and airway impaction in the right upper lobe most in keeping with chronic atypical mycobacterial or fungal infection. The findings are indeterminate but most likely represent progression of the same process    Assessment   Cavitary lung disease p  M TB age 21 ? partially treated  Wt loss/ anorexia, increase cough and sob x 2022  - referred to Health dept 05/07/2021 with new cavitary lung dz >>>   She was treated in the mid to late 1940s for TB in [place] with scarring / bronchiectasis R > L lung but now has cavitary dz and wt loss without purulent sputum which to me favors M TB over super bacterial infection in a bronchiectatic segment or Atypical TB so first step is to r/o MTB and let Health dept  collect sputum while she stays at home rather than pursuing CT's/ FOB's in this chronic appearing illness.   Discussed in detail all the  indications, usual  risks and alternatives  relative to the benefits with patient who agrees to proceed with w/u as outlined.     Notified health dept (Tammy) TB desk and will see her back here once TB in all forms is excluded (if she has atypical tb on sputum then rec refer directly to ID )  Each maintenance medication was reviewed in detail including emphasizing most importantly the difference between maintenance and prns and under what circumstances the prns are to be triggered using an action plan format where appropriate.  Total time for H and P, chart review, counseling  and generating customized AVS unique to this office visit / same day charting  > 45 min           Christinia Gully, MD 05/07/2021

## 2021-05-07 NOTE — Assessment & Plan Note (Signed)
Wt loss/ anorexia, increase cough and sob x 2022  - referred to Health dept 05/07/2021 with new cavitary lung dz >>>   She was treated in the mid to late 1940s for TB in [place] with scarring / bronchiectasis R > L lung but now has cavitary dz and wt loss without purulent sputum which to me favors M TB over super bacterial infection in a bronchiectatic segment or Atypical TB so first step is to r/o MTB and let Health dept collect sputum while she stays at home rather than pursuing CT's/ FOB's in this chronic appearing illness.   Discussed in detail all the  indications, usual  risks and alternatives  relative to the benefits with patient who agrees to proceed with w/u as outlined.     Notified health dept (Tammy) TB desk and will see her back here once TB in all forms is excluded (if she has atypical tb on sputum then rec refer directly to ID )           Each maintenance medication was reviewed in detail including emphasizing most importantly the difference between maintenance and prns and under what circumstances the prns are to be triggered using an action plan format where appropriate.  Total time for H and P, chart review, counseling  and generating customized AVS unique to this office visit / same day charting  > 45 min

## 2021-05-09 NOTE — Telephone Encounter (Signed)
Called pt's daughter and there was no answer- LMTCB  

## 2021-05-09 NOTE — Telephone Encounter (Signed)
Called and spoke with pt's niece (POA) Enid Derry and answered all the questions that she had. Nothing further needed.

## 2021-05-14 ENCOUNTER — Telehealth: Payer: Self-pay | Admitting: Internal Medicine

## 2021-05-14 DIAGNOSIS — J984 Other disorders of lung: Secondary | ICD-10-CM

## 2021-05-14 NOTE — Telephone Encounter (Signed)
Lara Mulch called from [facility] Dept regarding the referral sent by Dr. Sherene Sires for TB. Patient has had 3 sputums, 2 pending and 1 has came back. Patient has had smears. Following provider at the Health Dept is Norval Gable, MD. Babette Relic will be faxing results over today or early tomorrow- she wanted to make the office aware.

## 2021-05-16 NOTE — Telephone Encounter (Signed)
Attempted to call Sarah Zavala with the Health dept to see if she did fax the results over to the office but the number was a fax number. Do not see anything in Dr. Gustavus Bryant papers.  Sending this to [place] to see if she may have come across this.

## 2021-05-19 NOTE — Telephone Encounter (Signed)
I won't be there till next week so fax to [place] office

## 2021-05-19 NOTE — Telephone Encounter (Signed)
Results received and were placed in Dr Thurston Hole lookat in A pod.

## 2021-05-19 NOTE — Telephone Encounter (Signed)
I just faxed to [place] office

## 2021-05-19 NOTE — Telephone Encounter (Signed)
I called Sarah Zavala back at the number provided and the line will not ring  Are they sending pt to ID?   Will try calling back later

## 2021-05-19 NOTE — Telephone Encounter (Signed)
Be sure we have them in Epic and that she has ID referral in the works

## 2021-05-20 NOTE — Telephone Encounter (Signed)
[facility] number listed but the line was silent and would disconnect.  Called [facility] Dept main number (on Google) and was then transferred to the TB dept and left a VM for Tammy Faucet.

## 2021-05-23 NOTE — Telephone Encounter (Signed)
Lara Mulch RN is returning phone call. Tammy phone number is [phone].

## 2021-05-23 NOTE — Telephone Encounter (Signed)
Called the health dept and was advised the correct person to speak with is Sarah Zavala- I was transferred to her but there was no answer and had to leave msg. Will await her call back.

## 2021-05-26 NOTE — Telephone Encounter (Signed)
I have placed the referral for the ID per Dr. Sherene Sires. Will wait on the form from health department for this patient.

## 2021-05-26 NOTE — Telephone Encounter (Signed)
I thought we had already made a formal request directly to ID for consult  re dx cavitary atypical tb

## 2021-05-26 NOTE — Telephone Encounter (Signed)
Received a call from [facility] from the [facility]. She stated that they are still awaiting 2 sputum cultures results before deciding on her treatment. As soon as the cultures are back, they will fax over the results and give our office a call as well.   For the ID referral, [facility] spoke with their medical director and they both agreed that the ID referral would need to come from Dr. Sherene Sires.

## 2021-06-03 ENCOUNTER — Telehealth: Payer: Self-pay

## 2021-06-03 NOTE — Telephone Encounter (Signed)
Per MD called [facility] Department for patient AFB smear + cultures. Spoke with Tammy who was able to fax records. ?P: [phone] ?Juanita Laster, RMA ? ?

## 2021-06-04 ENCOUNTER — Ambulatory Visit: Payer: Medicare Other | Admitting: Internal Medicine

## 2021-06-04 DIAGNOSIS — A310 Pulmonary mycobacterial infection: Secondary | ICD-10-CM | POA: Insufficient documentation

## 2021-06-08 IMAGING — CT CT ANGIO CHEST
2 of 6 series · 18 of 36 positions shown · IV contrast (omnipaque)
Comparison: 04/22/2008, 05/16/2019

CLINICAL DATA: Hemoptysis, elevated D-dimer. COVID booster
vaccination 3 days prior

EXAM:
CT ANGIOGRAPHY CHEST WITH CONTRAST
TECHNIQUE: Multidetector CT imaging of the chest was performed using the
standard protocol during bolus administration of intravenous
contrast. Multiplanar CT image reconstructions and MIPs were
obtained to evaluate the vascular anatomy.
CONTRAST:  80mL OMNIPAQUE IOHEXOL 350 MG/ML SOLN

[Series 5: thins · axial · 0.57mm/px · z∈[-324,-52]mm · 17 of 307 slices shown]
[im 18/307  lung]
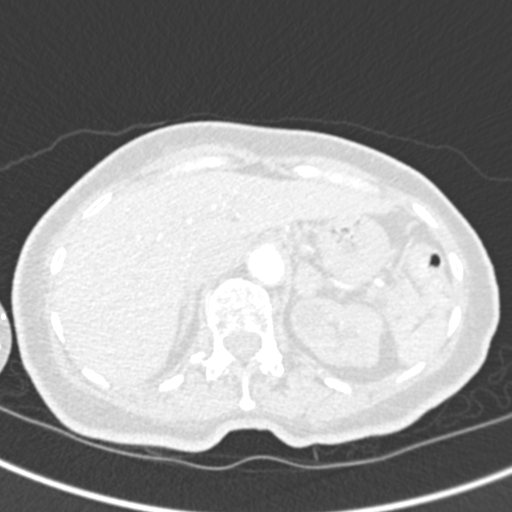
[im 35/307  mediastinal]
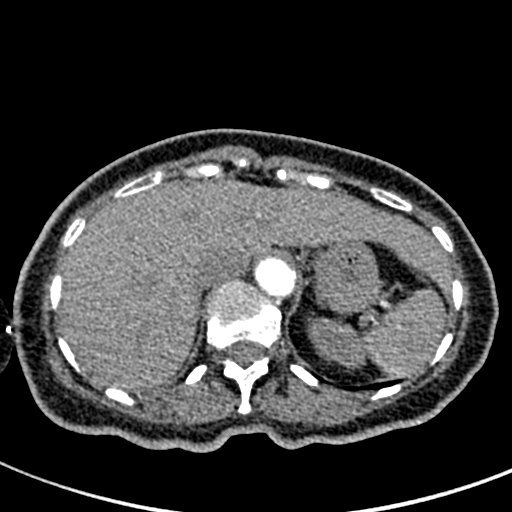
[im 52/307  lung]
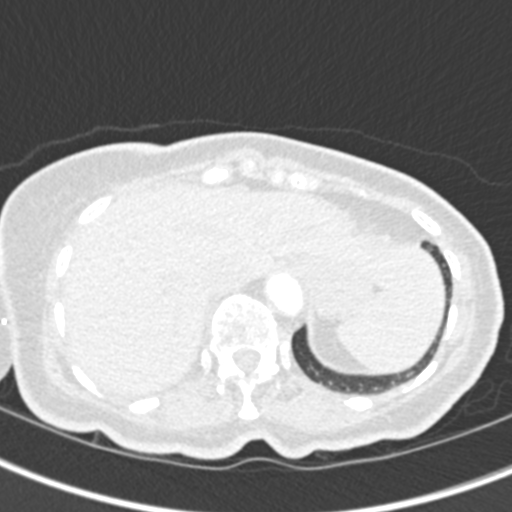
[im 69/307  mediastinal]
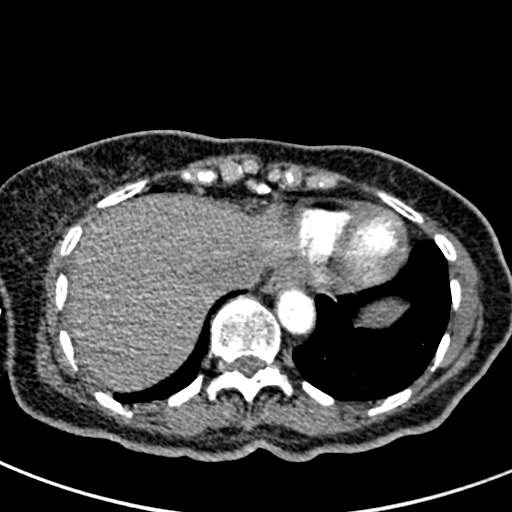
[im 86/307  lung]
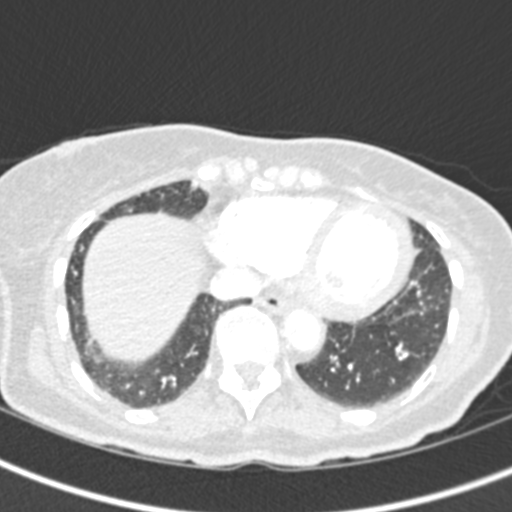
[im 103/307  mediastinal]
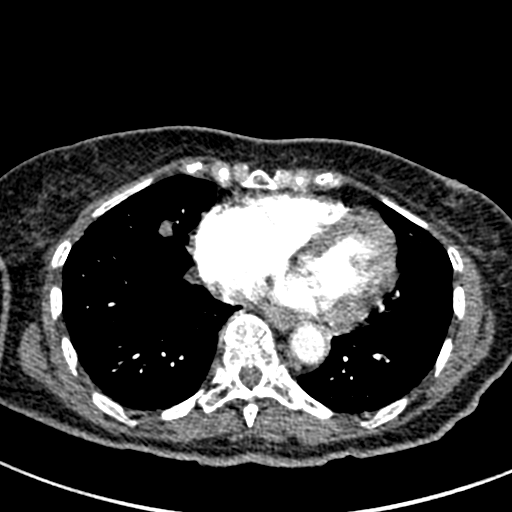
[im 120/307  lung]
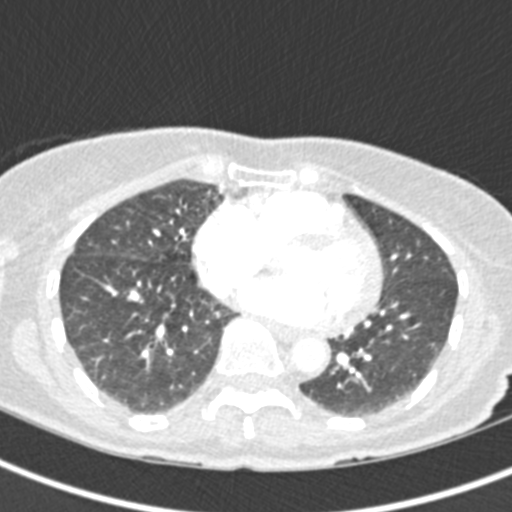
[im 137/307  mediastinal]
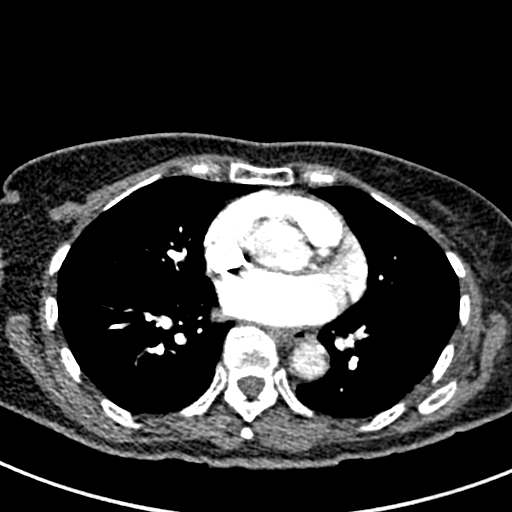
[im 154/307  lung]
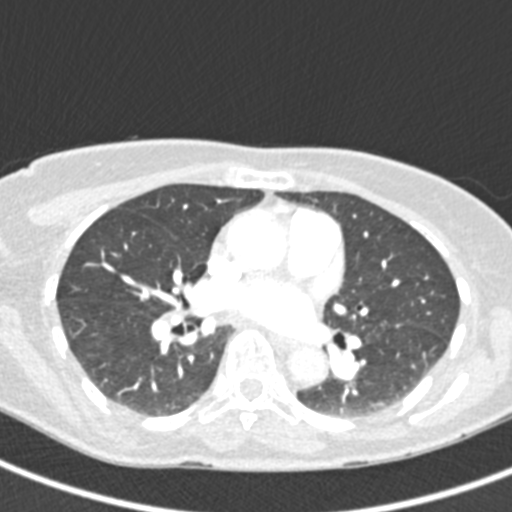
[im 171/307  mediastinal]
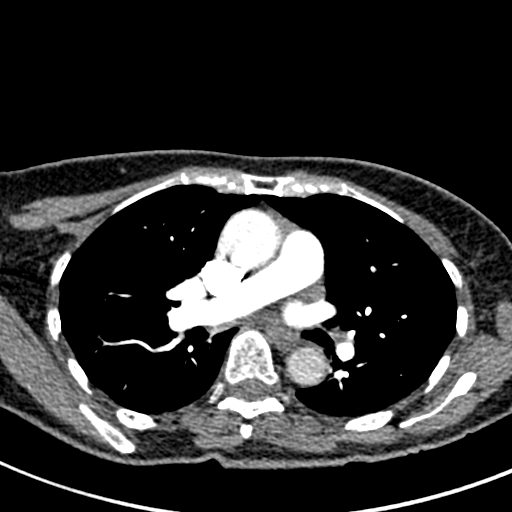
[im 188/307  lung]
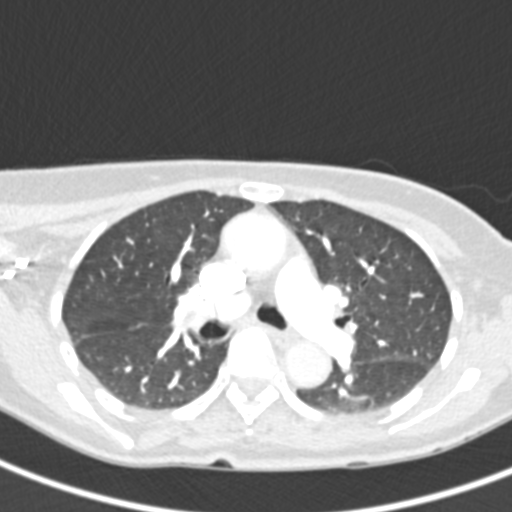
[im 205/307  mediastinal]
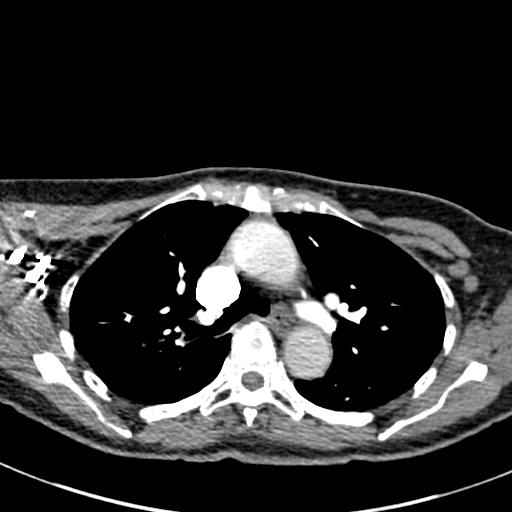
[im 222/307  lung]
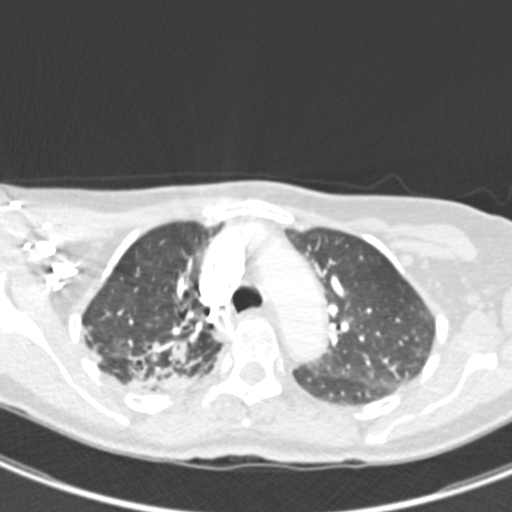
[im 239/307  mediastinal]
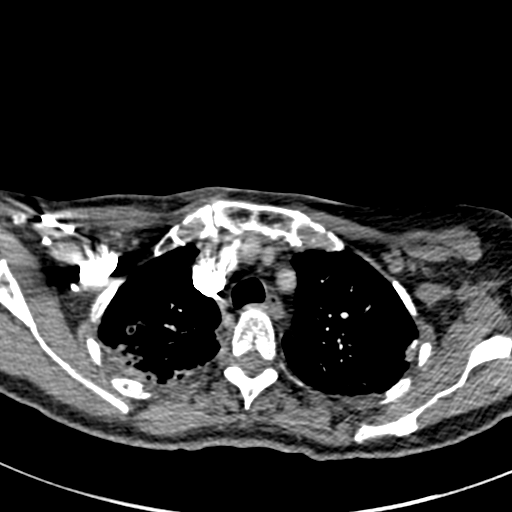
[im 256/307  lung]
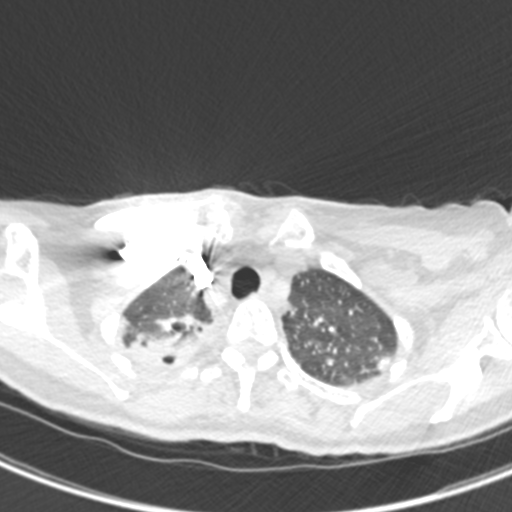
[im 273/307  mediastinal]
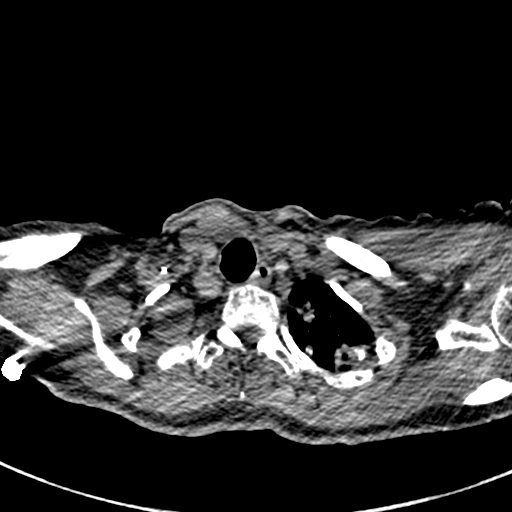
[im 290/307  lung]
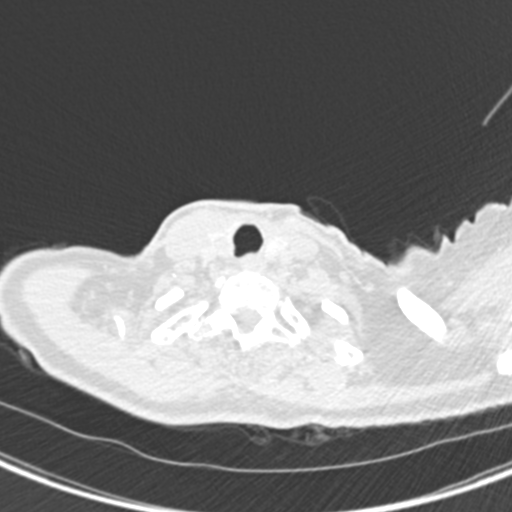

[Series 7: coronal mpr · coronal · 0.59mm/px · 1 of 90 slices shown]
[im 45/90  mediastinal]
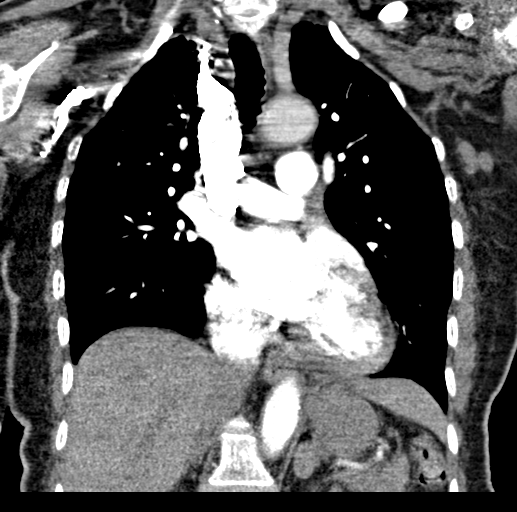

[18 of 36 positions shown; findings below may reference images not displayed]

FINDINGS: Cardiovascular: There is adequate opacification of the pulmonary
arterial tree no significant coronary artery calcification. No
intraluminal filling defect identified to suggest acute pulmonary
embolism. Global cardiac size within normal limits. No pericardial
effusion. Central pulmonary arteries are of normal caliber. Minimal
atherosclerotic calcification within the thoracic aorta. No aortic
aneurysm.

Mediastinum/Nodes: There is shotty left axillary adenopathy which is
nonspecific and may relate to the given history of recent
vaccination. No frankly pathologic adenopathy within the thorax. 14
mm right thyroid nodule noted. Further follow-up is not required
given its size and the patient's age. The esophagus is unremarkable

Lungs/Pleura: There is asymmetric bronchiectasis, airway impaction,
and parenchymal scarring within the right upper lobe which appears
similar in appearance to prior examination. Differential
considerations are unchanged, in keeping with the sequela of remote
infection or changes of ongoing atypical infection such as
mycobacterial or fungal infection. While stable since immediate
prior examination, these are progressive since remote prior
examination of 0737. Mild pleuroparenchymal scarring is seen at the
left apex. Stable 10 mm nodule within the basilar right middle lobe,
axial image # 103/6. Unchanged micro nodularity within the right
lower lobe at the lung base, possibly the sequela of remote
infection or inflammation. No pneumothorax or pleural effusion. No
central obstructing mass.

Upper Abdomen: No acute abnormality.

Musculoskeletal: No acute bone abnormality.

Review of the MIP images confirms the above findings.
IMPRESSION: No pulmonary embolism.

Bronchiectasis, architectural distortion, and airway impaction
within the right upper lobe most in keeping with chronic atypical
mycobacterial or fungal infection. While stable since prior
examination, the parenchymal changes are progressive since remote
prior examination of 04/22/2008.

Stable right middle lobe pulmonary nodule and adjacent right lower
lobe micronodularity, possibly the sequela of remote infection or
inflammation. Given its size, continued follow-up evaluation is
recommended at 18-24 months from the original examination. This
recommendation follows the consensus statement: Guidelines for
Management of Incidental Pulmonary Nodules Detected on CT Images:

Aortic Atherosclerosis (778TM-KWM.M).

## 2021-06-11 ENCOUNTER — Other Ambulatory Visit: Payer: Self-pay

## 2021-06-11 ENCOUNTER — Ambulatory Visit (INDEPENDENT_AMBULATORY_CARE_PROVIDER_SITE_OTHER): Payer: Medicare Other | Admitting: Infectious Disease

## 2021-06-11 ENCOUNTER — Telehealth: Payer: Self-pay

## 2021-06-11 ENCOUNTER — Encounter: Payer: Self-pay | Admitting: Infectious Disease

## 2021-06-11 VITALS — BP 140/77 | HR 102 | Temp 97.7°F | Wt 134.0 lb

## 2021-06-11 DIAGNOSIS — Z8611 Personal history of tuberculosis: Secondary | ICD-10-CM

## 2021-06-11 DIAGNOSIS — J984 Other disorders of lung: Secondary | ICD-10-CM | POA: Diagnosis not present

## 2021-06-11 DIAGNOSIS — R413 Other amnesia: Secondary | ICD-10-CM

## 2021-06-11 DIAGNOSIS — A310 Pulmonary mycobacterial infection: Secondary | ICD-10-CM

## 2021-06-11 HISTORY — DX: Personal history of tuberculosis: Z86.11

## 2021-06-11 NOTE — Telephone Encounter (Signed)
Left voicemail for Sarah Zavala at [facility] requesting mycobacteria culture sensitivity.  ? ?Sandie Ano, RN ? ?

## 2021-06-11 NOTE — Addendum Note (Signed)
Addended by: Linna Hoff D on: 06/11/2021 12:25 PM ? ? Modules accepted: Orders ? ?

## 2021-06-11 NOTE — Progress Notes (Addendum)
? 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 ?[facility] physician: Sandrea Hughs, MD ? ? Patient ID: Sarah Zavala, female    DOB: 1929/05/20, 88 y.o.   MRN: [id number] ? ?HPI ? ?Sarah Zavala is an 88 year old Black woman with history of having a having had tuberculosis at the age of 72.  Se was treated with an oral solution "that they gave all of [place]." ? ?He has had CT scans and chest x-rays that have shown evidence of nodules and bronchiectatic changes of these have largely been stable. ? ?However this fall she began having cough and in fact was seen in the ER for hemoptysis. ?Last x-ray done in February showed some progression of this pathology with increasing cavitation in the right side in particular. ? ?She also apparently had been having weight loss and reduced appetite. ? ?She was ultimately referred to Dr. Sherene Sires with pulmonary. ? ?He get the patient in touch with the [facility] department.  They have collected 3 sputum for AFB and culture.  They are positive for AFB by smear but negative for bacterium tuberculosis by PCR and positive for Mycobacterium AVM.  Susceptibilities are not back yet. ? ?Currently the patient does not have a cough at all it appears ? ?She has lost weight but denies fevers chills drenching sweats. ? ?She is accompanied by her nephew who provides a great deal of the history I believe due to the patient having some cognitive and memory deficits. ? ?He has no acute distress whatsoever today. ? ? ? ? ?Past Medical History:  ?Diagnosis Date  ? Acute bronchitis   ? Acute maxillary sinusitis   ? Allergic rhinitis due to pollen   ? Conjunctivitis unspecified   ? Essential hypertension, malignant   ? History of TB (tuberculosis) 06/11/2021  ? Hyperlipidemia   ? Memory impairment   ? age related  ? Mitral valve disorders(424.0)   ? Pure hypercholesterolemia   ? Situational stress   ? UTI (lower urinary tract infection)   ? ? ?Past Surgical  History:  ?Procedure Laterality Date  ? ABDOMINAL HYSTERECTOMY    ? TONSILLECTOMY    ? TUBAL LIGATION    ? ? ?Family History  ?Problem Relation Age of Onset  ? Breast cancer Neg Hx   ? ? ?  ?Social History  ? ?Socioeconomic History  ? Marital status: Married  ?  Spouse name: Not on file  ? Number of children: Not on file  ? Years of education: Not on file  ? Highest education level: Not on file  ?Occupational History  ? Not on file  ?Tobacco Use  ? Smoking status: Never  ? Smokeless tobacco: Never  ?Vaping Use  ? Vaping Use: Never used  ?Substance and Sexual Activity  ? Alcohol use: Never  ? Drug use: Never  ? Sexual activity: Not on file  ?Other Topics Concern  ? Not on file  ?Social History Narrative  ? Not on file  ? ?Social Determinants of Health  ? ?Financial Resource Strain: Not on file  ?Food Insecurity: Not on file  ?Transportation Needs: Not on file  ?Physical Activity: Not on file  ?Stress: Not on file  ?Social Connections: Not on file  ? ? ?Allergies  ?Allergen Reactions  ? Acyclovir And Related   ? Amoxicillin   ? Biaxin [Clarithromycin]   ? Ciprofloxacin   ? ? ? ?Current Outpatient Medications:  ?  Calcium Carbonate-Vitamin D (CALCIUM  PLUS VITAMIN D PO), Take 1,200 mg by mouth daily.  , Disp: , Rfl:  ?  dorzolamide (TRUSOPT) 2 % ophthalmic solution, Place 1 drop into both eyes 2 (two) times daily., Disp: , Rfl:  ?  memantine (NAMENDA) 10 MG tablet, Take 10 mg by mouth at bedtime.  , Disp: , Rfl:  ?  Multiple Vitamins-Minerals (CENTRUM SILVER PO), Take by mouth daily.  , Disp: , Rfl:  ?  rosuvastatin (CRESTOR) 5 MG tablet, Take 10 mg by mouth daily. , Disp: , Rfl:  ?  valACYclovir (VALTREX) 500 MG tablet, Take 500 mg by mouth daily.  , Disp: , Rfl:  ?  aspirin 81 MG tablet, Take 81 mg by mouth daily.   (Patient not taking: Reported on 06/11/2021), Disp: , Rfl:  ?  benzonatate (TESSALON) 100 MG capsule, Take 1 capsule (100 mg total) by mouth every 8 (eight) hours. (Patient not taking: Reported on  05/07/2021), Disp: 21 capsule, Rfl: 0 ?  cetirizine (ZYRTEC) 5 MG tablet, Take 1 tablet (5 mg total) by mouth daily. (Patient not taking: Reported on 05/07/2021), Disp: 15 tablet, Rfl: 0 ?  diltiazem (DILACOR XR) 120 MG 24 hr capsule, Take 120 mg by mouth daily. (Patient not taking: Reported on 05/07/2021), Disp: , Rfl:  ?  fluticasone (FLONASE) 50 MCG/ACT nasal spray, Place 2 sprays into both nostrils daily. (Patient not taking: Reported on 05/07/2021), Disp: 1 g, Rfl: 0 ?  Omega-3 Fatty Acids (FISH OIL PO), Take 1,000 mg by mouth daily.   (Patient not taking: Reported on 05/07/2021), Disp: , Rfl:  ? ?Review of Systems  ?Unable to perform ROS: Age  ? ?   ?Objective:  ? Physical Exam ?Constitutional:   ?   General: She is not in acute distress. ?   Appearance: Normal appearance. She is well-developed and underweight. She is not ill-appearing or diaphoretic.  ?HENT:  ?   Head: Normocephalic and atraumatic.  ?   Right Ear: Hearing and external ear normal.  ?   Left Ear: Hearing and external ear normal.  ?   Nose: No nasal deformity or rhinorrhea.  ?Eyes:  ?   General: No scleral icterus. ?   Conjunctiva/sclera: Conjunctivae normal.  ?   Right eye: Right conjunctiva is not injected.  ?   Left eye: Left conjunctiva is not injected.  ?   Pupils: Pupils are equal, round, and reactive to light.  ?Neck:  ?   Vascular: No JVD.  ?Cardiovascular:  ?   Rate and Rhythm: Normal rate and regular rhythm.  ?   Heart sounds: Normal heart sounds, S1 normal and S2 normal. No murmur heard. ?  No friction rub. No gallop.  ?Pulmonary:  ?   Effort: Pulmonary effort is normal. No respiratory distress.  ?   Breath sounds: No stridor. No wheezing or rales.  ?Abdominal:  ?   General: Bowel sounds are normal. There is no distension.  ?   Palpations: Abdomen is soft.  ?   Tenderness: There is no abdominal tenderness.  ?Musculoskeletal:     ?   General: Normal range of motion.  ?   Right shoulder: Normal.  ?   Left shoulder: Normal.  ?   Cervical back:  Normal range of motion and neck supple.  ?   Right hip: Normal.  ?   Left hip: Normal.  ?   Right knee: Normal.  ?   Left knee: Normal.  ?Lymphadenopathy:  ?   Head:  ?  Right side of head: No submandibular, preauricular or posterior auricular adenopathy.  ?   Left side of head: No submandibular, preauricular or posterior auricular adenopathy.  ?   Cervical: No cervical adenopathy.  ?   Right cervical: No superficial or deep cervical adenopathy. ?   Left cervical: No superficial or deep cervical adenopathy.  ?Skin: ?   General: Skin is warm and dry.  ?   Coloration: Skin is not pale.  ?   Findings: No abrasion, bruising, ecchymosis, erythema, lesion or rash.  ?   Nails: There is no clubbing.  ?Neurological:  ?   General: No focal deficit present.  ?   Mental Status: She is alert and oriented to person, place, and time.  ?   Sensory: No sensory deficit.  ?   Coordination: Coordination normal.  ?   Gait: Gait normal.  ?Psychiatric:     ?   Attention and Perception: She is attentive.     ?   Mood and Affect: Mood normal.     ?   Speech: Speech normal.     ?   Behavior: Behavior normal. Behavior is cooperative.     ?   Thought Content: Thought content normal.     ?   Cognition and Memory: Memory is impaired.     ?   Judgment: Judgment normal.  ? ? ? ? ? ?   ?Assessment & Plan:  ? ?Mycobacterium Avium infection: ? ?We asked the health department for the specimen to be sent for susceptibility data once the organism grows on culture. ? ?Had extensive discussion about the risks and benefits of treating this mycobacterial infection and decided against treatment at this point in time. ? ?Hx of TB that was partially treated 73 years ago. Sputum negative for TB at [facility]. I was told that she was started on treatment for one day for TB and this was stopped due to a blood test. I am assuming instead that it was started based on the sputum being positive but then the PCR is negative and then this resulted it being stopped but I do  not have all the documentation from the health department regarding  ? ? ?Memory problems: She is moving into assisted living and she has several family members helping her with her appointments and visits. ? ?Her nie

## 2021-06-11 NOTE — Telephone Encounter (Signed)
Spoke with Tammy, TB nurse at [facility], requested patient smear results and any office progress notes. Tammy to fax requested records to triage.  ? ?Sandie Ano, RN ? ?

## 2021-06-11 NOTE — Telephone Encounter (Signed)
Faxed [facility] requisition and request form to [facility] for add on susceptibility testing.  ? ?[facility]  ?P: [phone] ?Fax: [phone] ? ?Sandie Ano, RN ? ?

## 2021-08-13 ENCOUNTER — Telehealth: Payer: Self-pay

## 2021-08-13 NOTE — Telephone Encounter (Addendum)
Patient niece Gonzella Lex, NP [phone] regarding the patient having some increased coughing and congestion.  She stated the patient's cough is non productive.  Patient is currently in [place] visiting her daughter, which she also flew.  Patient taking Coricidin HBP for her symptoms.  Patient niece is wondering if they should be doing anything different. Reshaun Briseno T Brooks Sailors

## 2021-08-14 NOTE — Telephone Encounter (Addendum)
I attempted to contact patient's niece Talbert Forest to let her know we can schedule the patient sooner with Dr. Daiva Eves to discuss treatment plan. Talbert Forest did not have a secured voicemail setup

## 2021-08-15 NOTE — Telephone Encounter (Signed)
Spoke to patient's niece and she scheduled patient an appointment for 08/26/21.  She also stated patient was feeling worse today and SOB. She advised the patient daughter in law to take the patient to UC in [place].

## 2021-08-18 ENCOUNTER — Telehealth: Payer: Self-pay

## 2021-08-18 NOTE — Telephone Encounter (Signed)
Patient's niece Talbert Forest called to let Dr. Daiva Eves know that the patient is currently hospitalized in [place]. States the patient experienced worsening cough while travelling, and that she is scheduled for a lung biopsy today or tomorrow. 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 Wanted KVD to be aware, given recent visit in March. Shirley's phone: [phone].   Wyvonne Lenz, RN

## 2021-08-19 NOTE — Telephone Encounter (Signed)
Per patient's niece returned phone call to Dr. Tommy Medal. She stated that the patient does have a mass on right upper lung. She stated pulmonologist that is seeing her in the hospital feels it is the Moore. Patient also being treated for pneumonia. Patient is scheduled to have a bronchoscopy tomorrow. Patients niece stated you can give her a call back when you get to the office tomorrow.

## 2021-08-22 ENCOUNTER — Telehealth: Payer: Self-pay | Admitting: Internal Medicine

## 2021-08-22 NOTE — Telephone Encounter (Signed)
Talbert Forest niece is returning phone call. Talbert Forest phone number is [phone].

## 2021-08-22 NOTE — Telephone Encounter (Signed)
Called patient's niece but she did not answer. Left message for her to call back.   I did try to see if I could access her [place] records but the facility she is at does not appear to have Epic access.

## 2021-08-22 NOTE — Telephone Encounter (Signed)
Pt has been hospitalized [facility] in [place], [place]. States cough and congestion returned prior to leaving for [place]. Has pneumonia and increased in size of the mass of her right upper lung. Had bronch and was doing better on Wednesday after, but she is now incoherrent and running a fever. Please advise.

## 2021-08-26 ENCOUNTER — Ambulatory Visit: Payer: Medicare Other | Admitting: Infectious Disease

## 2021-08-26 NOTE — Progress Notes (Deleted)
Subjective:  Chief complaint:   Patient ID: Sarah Zavala, female    DOB: 24-May-1929, 88 y.o.   MRN: [id number]  HPI  Gilberto is an 88 year old Black woman with history of having a having had tuberculosis at the age of 35.  Se was treated with an oral solution "that they gave all of [place]."  He has had CT scans and chest x-rays that have shown evidence of nodules and bronchiectatic changes of these have largely been stable.  However this fall she began having cough and in fact was seen in the ER for hemoptysis. Last x-ray done in February showed some progression of this pathology with increasing cavitation in the right side in particular.  She also apparently had been having weight loss and reduced appetite.  She was ultimately referred to Dr. Sherene Sires with pulmonary.  He get the patient in touch with the [facility] department.  They have collected 3 sputum for AFB and culture.  They are positive for AFB by smear but negative for bacterium tuberculosis by PCR and positive for Mycobacterium AVM.  Susceptibilities are not back yet.  Currently the patient does not have a cough at all it appears  She has lost weight but denies fevers chills drenching sweats.  She is accompanied by her nephew who provides a great deal of the history I believe due to the patient having some cognitive and memory deficits.  He has no acute distress whatsoever today.     Past Medical History:  Diagnosis Date   Acute bronchitis    Acute maxillary sinusitis    Allergic rhinitis due to pollen    Conjunctivitis unspecified    Essential hypertension, malignant    History of TB (tuberculosis) 06/11/2021   Hyperlipidemia    Memory impairment    age related   Mitral valve disorders(424.0)    Pure hypercholesterolemia    Situational stress    UTI (lower urinary tract infection)     Past Surgical History:  Procedure Laterality Date   ABDOMINAL HYSTERECTOMY     TONSILLECTOMY     TUBAL LIGATION       Family History  Problem Relation Age of Onset   Breast cancer Neg Hx       Social History   Socioeconomic History   Marital status: Married    Spouse name: Not on file   Number of children: Not on file   Years of education: Not on file   Highest education level: Not on file  Occupational History   Not on file  Tobacco Use   Smoking status: Never   Smokeless tobacco: Never  Vaping Use   Vaping Use: Never used  Substance and Sexual Activity   Alcohol use: Never   Drug use: Never   Sexual activity: Not on file  Other Topics Concern   Not on file  Social History Narrative   Not on file   Social Determinants of Health   Financial Resource Strain: Not on file  Food Insecurity: Not on file  Transportation Needs: Not on file  Physical Activity: Not on file  Stress: Not on file  Social Connections: Not on file    Allergies  Allergen Reactions   Acyclovir And Related    Amoxicillin    Biaxin [Clarithromycin]    Ciprofloxacin      Current Outpatient Medications:    aspirin 81 MG tablet, Take 81 mg by mouth daily.   (Patient not taking: Reported on 06/11/2021), Disp: ,  Rfl:    benzonatate (TESSALON) 100 MG capsule, Take 1 capsule (100 mg total) by mouth every 8 (eight) hours. (Patient not taking: Reported on 05/07/2021), Disp: 21 capsule, Rfl: 0   Calcium Carbonate-Vitamin D (CALCIUM PLUS VITAMIN D PO), Take 1,200 mg by mouth daily.  , Disp: , Rfl:    cetirizine (ZYRTEC) 5 MG tablet, Take 1 tablet (5 mg total) by mouth daily. (Patient not taking: Reported on 05/07/2021), Disp: 15 tablet, Rfl: 0   diltiazem (DILACOR XR) 120 MG 24 hr capsule, Take 120 mg by mouth daily. (Patient not taking: Reported on 05/07/2021), Disp: , Rfl:    dorzolamide (TRUSOPT) 2 % ophthalmic solution, Place 1 drop into both eyes 2 (two) times daily., Disp: , Rfl:    fluticasone (FLONASE) 50 MCG/ACT nasal spray, Place 2 sprays into both nostrils daily. (Patient not taking: Reported on 05/07/2021), Disp:  1 g, Rfl: 0   memantine (NAMENDA) 10 MG tablet, Take 10 mg by mouth at bedtime.  , Disp: , Rfl:    Multiple Vitamins-Minerals (CENTRUM SILVER PO), Take by mouth daily.  , Disp: , Rfl:    Omega-3 Fatty Acids (FISH OIL PO), Take 1,000 mg by mouth daily.   (Patient not taking: Reported on 05/07/2021), Disp: , Rfl:    rosuvastatin (CRESTOR) 5 MG tablet, Take 10 mg by mouth daily. , Disp: , Rfl:    valACYclovir (VALTREX) 500 MG tablet, Take 500 mg by mouth daily.  , Disp: , Rfl:   Review of Systems     Objective:   Physical Exam        Assessment & Plan:   Mycobacterium Avium infection:  We asked the health department for the specimen to be sent for susceptibility data once the organism grows on culture.  Had extensive discussion about the risks and benefits of treating this mycobacterial infection and decided against treatment at this point in time.  Hx of TB that was partially treated 73 years ago. Sputum negative for TB at [facility]. I was told that she was started on treatment for one day for TB and this was stopped due to a blood test. I am assuming instead that it was started based on the sputum being positive but then the PCR is negative and then this resulted it being stopped but I do not have all the documentation from the health department regarding    Memory problems: She is moving into assisted living and she has several family members helping her with her appointments and visits.  Her niece will help set up a MyChart account so they can read our notes and keep up with her medical care.  I spent 82 minutes with the patient including than 50% of the time in face to face counseling of the patient and her nephew regarding the nature of Mycobacterium  avium infection and the risk benefits equations that we try to address with treatment vs no treatment, personally reviewing this x-ray from February 2023 CT of the lungs from 2021 along with review of medical records in preparation for the  visit and during the visit and in coordination of her care.

## 2021-08-26 NOTE — Telephone Encounter (Signed)
Patient niece called to inform Dr.Van Dam - patient is currently hospitalized in [place] and her health has took a major decline since last conversation. They have reached out to Hospice for them to do a evaluation on patient to determine. Shirely stated that if Dr.Van Dam wanted to speak with her, phone number is [phone]

## 2021-09-05 ENCOUNTER — Telehealth: Payer: Self-pay | Admitting: Internal Medicine

## 2021-09-05 ENCOUNTER — Telehealth: Payer: Self-pay

## 2021-09-05 NOTE — Telephone Encounter (Signed)
FYI Dr. Sherene Sires    called to let us know that pt passed away while in [place]. Wanted MW aware

## 2021-09-05 NOTE — Telephone Encounter (Addendum)
Talbert Forest patient's niece called and want to inform Dr. Daiva Eves that patient expired on September 16, 2021. Cheron Coryell T Pricilla Loveless

## 2021-09-24 NOTE — Telephone Encounter (Signed)
Since the hospitalization, pt has passed away. Closing encounter.

## 2021-09-27 DEATH — deceased

## 2021-10-24 ENCOUNTER — Ambulatory Visit: Payer: Medicare Other | Admitting: Infectious Disease
